# Patient Record
Sex: Female | Born: 1935 | Race: White | Hispanic: No | Smoking: Never smoker
Health system: Southern US, Community
[De-identification: ages and names within clinical notes are randomized; demographics above are authoritative.]

## PROBLEM LIST (undated history)

## (undated) DIAGNOSIS — I10 Essential (primary) hypertension: Secondary | ICD-10-CM

## (undated) DIAGNOSIS — N289 Disorder of kidney and ureter, unspecified: Secondary | ICD-10-CM

## (undated) DIAGNOSIS — E785 Hyperlipidemia, unspecified: Secondary | ICD-10-CM

## (undated) DIAGNOSIS — I4891 Unspecified atrial fibrillation: Secondary | ICD-10-CM

## (undated) DIAGNOSIS — R011 Cardiac murmur, unspecified: Secondary | ICD-10-CM

## (undated) DIAGNOSIS — H3530 Unspecified macular degeneration: Secondary | ICD-10-CM

## (undated) HISTORY — DX: Disorder of kidney and ureter, unspecified: N28.9

## (undated) HISTORY — DX: Essential (primary) hypertension: I10

## (undated) HISTORY — DX: Hyperlipidemia, unspecified: E78.5

## (undated) HISTORY — DX: Unspecified atrial fibrillation: I48.91

## (undated) HISTORY — DX: Unspecified macular degeneration: H35.30

## (undated) HISTORY — DX: Cardiac murmur, unspecified: R01.1

---
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
## 1997-10-09 HISTORY — PX: CATARACT EXTRACTION: SUR2

## 2005-02-28 ENCOUNTER — Ambulatory Visit: Payer: Self-pay

## 2005-04-05 ENCOUNTER — Ambulatory Visit: Payer: Self-pay

## 2006-06-21 ENCOUNTER — Ambulatory Visit: Payer: Self-pay

## 2006-06-26 ENCOUNTER — Ambulatory Visit: Payer: Self-pay

## 2008-05-29 ENCOUNTER — Ambulatory Visit: Payer: Self-pay | Admitting: Internal Medicine

## 2008-05-29 DIAGNOSIS — E785 Hyperlipidemia, unspecified: Secondary | ICD-10-CM

## 2008-05-29 DIAGNOSIS — M1990 Unspecified osteoarthritis, unspecified site: Secondary | ICD-10-CM | POA: Insufficient documentation

## 2008-05-29 DIAGNOSIS — R002 Palpitations: Secondary | ICD-10-CM

## 2008-05-29 DIAGNOSIS — R634 Abnormal weight loss: Secondary | ICD-10-CM

## 2008-05-29 DIAGNOSIS — H3530 Unspecified macular degeneration: Secondary | ICD-10-CM | POA: Insufficient documentation

## 2008-06-02 LAB — CONVERTED CEMR LAB
ALT: 13 units/L (ref 0–35)
AST: 20 units/L (ref 0–37)
Albumin: 4 g/dL (ref 3.5–5.2)
Alkaline Phosphatase: 73 units/L (ref 39–117)
BUN: 17 mg/dL (ref 6–23)
Basophils Absolute: 0 10*3/uL (ref 0.0–0.1)
Basophils Relative: 0.8 % (ref 0.0–3.0)
Bilirubin, Direct: 0.1 mg/dL (ref 0.0–0.3)
CO2: 31 meq/L (ref 19–32)
Calcium: 8.9 mg/dL (ref 8.4–10.5)
Chloride: 104 meq/L (ref 96–112)
Cholesterol: 270 mg/dL (ref 0–200)
Creatinine, Ser: 1 mg/dL (ref 0.4–1.2)
Direct LDL: 128.8 mg/dL
Eosinophils Absolute: 0.1 10*3/uL (ref 0.0–0.7)
Eosinophils Relative: 1.6 % (ref 0.0–5.0)
GFR calc Af Amer: 68 mL/min
GFR calc non Af Amer: 56 mL/min
Glucose, Bld: 95 mg/dL (ref 70–99)
HCT: 37.4 % (ref 36.0–46.0)
HDL: 97.6 mg/dL (ref >39.0)
Hemoglobin: 12.7 g/dL (ref 12.0–15.0)
Lymphocytes Relative: 26.1 % (ref 12.0–46.0)
MCHC: 33.9 g/dL (ref 30.0–36.0)
MCV: 101.4 fL — ABNORMAL HIGH (ref 78.0–100.0)
Monocytes Absolute: 0.5 10*3/uL (ref 0.1–1.0)
Monocytes Relative: 7.9 % (ref 3.0–12.0)
Neutro Abs: 3.7 10*3/uL (ref 1.4–7.7)
Neutrophils Relative %: 63.6 % (ref 43.0–77.0)
Phosphorus: 4.3 mg/dL (ref 2.3–4.6)
Platelets: 222 10*3/uL (ref 150–400)
Potassium: 3.9 meq/L (ref 3.5–5.1)
RBC: 3.69 M/uL — ABNORMAL LOW (ref 3.87–5.11)
RDW: 13.6 % (ref 11.5–14.6)
Sed Rate: 13 mm/hr (ref 0–22)
Sodium: 143 meq/L (ref 135–145)
TSH: 1.45 microintl units/mL (ref 0.35–5.50)
Total Bilirubin: 0.8 mg/dL (ref 0.3–1.2)
Total CHOL/HDL Ratio: 2.8
Total Protein: 6.8 g/dL (ref 6.0–8.3)
Triglycerides: 49 mg/dL (ref 0–149)
VLDL: 10 mg/dL (ref 0–40)
WBC: 5.8 10*3/uL (ref 4.5–10.5)

## 2010-08-30 ENCOUNTER — Inpatient Hospital Stay: Payer: Self-pay | Admitting: Internal Medicine

## 2010-08-30 ENCOUNTER — Encounter: Payer: Self-pay | Admitting: Family Medicine

## 2010-08-31 ENCOUNTER — Encounter: Payer: Self-pay | Admitting: Family Medicine

## 2010-08-31 ENCOUNTER — Encounter: Payer: Self-pay | Admitting: Internal Medicine

## 2010-09-01 ENCOUNTER — Encounter: Payer: Self-pay | Admitting: Internal Medicine

## 2010-09-02 ENCOUNTER — Encounter: Payer: Self-pay | Admitting: Family Medicine

## 2010-09-05 ENCOUNTER — Telehealth: Payer: Self-pay | Admitting: Internal Medicine

## 2010-09-06 ENCOUNTER — Encounter: Payer: Self-pay | Admitting: Internal Medicine

## 2010-09-08 ENCOUNTER — Encounter: Payer: Self-pay | Admitting: Family Medicine

## 2010-09-12 ENCOUNTER — Encounter: Payer: Self-pay | Admitting: Family Medicine

## 2010-09-14 ENCOUNTER — Encounter: Payer: Self-pay | Admitting: Family Medicine

## 2010-09-14 DIAGNOSIS — I10 Essential (primary) hypertension: Secondary | ICD-10-CM | POA: Insufficient documentation

## 2010-09-14 DIAGNOSIS — D62 Acute posthemorrhagic anemia: Secondary | ICD-10-CM | POA: Insufficient documentation

## 2010-09-14 DIAGNOSIS — K922 Gastrointestinal hemorrhage, unspecified: Secondary | ICD-10-CM | POA: Insufficient documentation

## 2010-09-15 ENCOUNTER — Telehealth: Payer: Self-pay | Admitting: Internal Medicine

## 2010-09-21 ENCOUNTER — Telehealth: Payer: Self-pay | Admitting: Family Medicine

## 2010-10-19 ENCOUNTER — Encounter: Payer: Self-pay | Admitting: Family Medicine

## 2010-10-24 ENCOUNTER — Telehealth: Payer: Self-pay | Admitting: Internal Medicine

## 2010-10-24 ENCOUNTER — Encounter: Payer: Self-pay | Admitting: Family Medicine

## 2010-10-27 ENCOUNTER — Other Ambulatory Visit: Payer: Self-pay | Admitting: Family Medicine

## 2010-10-27 ENCOUNTER — Ambulatory Visit
Admission: RE | Admit: 2010-10-27 | Discharge: 2010-10-27 | Payer: Self-pay | Source: Home / Self Care | Attending: Family Medicine | Admitting: Family Medicine

## 2010-10-27 LAB — BASIC METABOLIC PANEL
BUN: 25 mg/dL — ABNORMAL HIGH (ref 6–23)
CO2: 29 mEq/L (ref 19–32)
Calcium: 8.8 mg/dL (ref 8.4–10.5)
Chloride: 101 mEq/L (ref 96–112)
Creatinine, Ser: 1.1 mg/dL (ref 0.4–1.2)
GFR: 48.66 mL/min — ABNORMAL LOW (ref >60.00)
Glucose, Bld: 84 mg/dL (ref 70–99)
Potassium: 4.1 mEq/L (ref 3.5–5.1)
Sodium: 139 mEq/L (ref 135–145)

## 2010-11-08 NOTE — Progress Notes (Signed)
Summary: regarding follow up  ---- Converted from flag ---- ---- 09/05/2010 2:46 PM, Claris Gower MD wrote: thanks  ---- 09/05/2010 2:19 PM, Marty Heck CMA, AAMA wrote: Lindsey Cline pt, she is fine with waiting till next wednesday.  She will check with Lindsey Cline about getting on the schedule,  faxed request for records to [facility], sent this note to Dr. Deborra Medina.  ---- 09/05/2010 10:47 AM, Claris Gower MD wrote: We can set up appt with Dr Deborra Medina at the clinic there for next Wednesday. I don't see patients at the clinic there due to my schedule Otherwise needs appt here Please get hospital records No clinic this week, that is why it would have to be next week. Let Dr Lindsey Cline if that is what she wants  Rich  ---- 09/05/2010 8:28 AM, Marty Heck CMA, AAMA wrote: Pt says she was recently released from [facility] for rectal bleeding and she is asking that you see her for a follow up at [facility].  Please advise. ------------------------------  Phone Note Call from Patient

## 2010-11-08 NOTE — Miscellaneous (Signed)
Summary: [facility],Blood Pressure Readings  [facility],Blood Pressure Readings   Imported By: Virgia Land 09/15/2010 15:28:56  _____________________________________________________________________  External Attachment:    Type:   Image     Comment:   External Document

## 2010-11-08 NOTE — Consult Note (Signed)
Summary: Dr.Robert Elliott,[facility]  Dr.Robert [place]   Imported By: Virgia Land 09/07/2010 15:24:07  _____________________________________________________________________  External Attachment:    Type:   Image     Comment:   External Document

## 2010-11-08 NOTE — Assessment & Plan Note (Signed)
Summary: [facility] Office Visit- hospital follow up.   Vital Signs:  Patient profile:   77 year old female Weight:      113 pounds BP sitting:   156 / 72  History of Present Illness: 77 yo new to me here for hospital follow up.  Hospital records reviewed: Hospitalized at [facility] 11/22- 09/02/2010 s/p rectal bleed, posthemorrhagic anemia, HTN, and renal insufficiency.  anemia- Presented to ED with 3 episodes of BRBPR without pain and found to be hypertensive and anemic with Hgb 9.5.  GI (Dr. Tiffany Kocher) consulted, bleed believed to be due to diverticular self resolving bleeding.  Does not appear that she received blood transfusion.  Did receive fluids, no colonoscopy performed.  Hgb at discharge was 11.7.   Repeat labs drawn at [facility] on 12/5 showed hbg stable at 11.2   Has had no further episodes of rectal bleeding.  Taking iron sulfate 325 mg daily along with daily stool softener.  No issues with constipation.  Has advanced her diet to normal without difficulty.  renal insufficiency- most likely due to prerenal ARF.  Cr slightly elevated to 1.14 at admission, now 1.02.    htn- elevated throughtout her admission.  BPs since discharge randing from 158/68- 172/76.   Pt not on any antihypertensives.  She is asytmptomatic, denying any HA, blurred vision, SOB or LE edema.   BP at previous OV in 2009 was within this range as well although her husband had died at that time. BP today 156/62.  CXR and 2 d echo unremarkable, hyperdynamic, EF 75%.  Review of Systems      See HPI General:  Denies malaise. ENT:  Denies difficulty swallowing. CV:  Denies chest pain or discomfort. Resp:  Denies shortness of breath. GI:  Denies abdominal pain, bloody stools, change in bowel habits, constipation, and nausea. MS:  Denies joint pain, joint redness, and joint swelling. Derm:  Denies rash. Neuro:  Denies headaches. Psych:  Complains of anxiety; denies depression. Endo:  Denies cold intolerance and  heat intolerance. Heme:  Complains of abnormal bruising. Allergy:  Denies persistent infections.  Physical Exam  General:  alert and normal appearance.   Eyes:  vision grossly intact.   Mouth:  no erythema and no lesions.   Lungs:  normal respiratory effort and normal breath sounds.   Heart:  normal rate, regular rhythm, and no gallop.   III/VI SEM  Abdomen:  soft, non-tender, no hepatomegaly, and no splenomegaly.   Extremities:  no edema or calf tenderness Skin:  eecchymosis at IV sites bilaterally, no palp hematomas, no skin breakdown visible.   Psych:  normally interactive, good eye contact, not anxious appearing, and not depressed appearing.     Impression & Recommendations:  Problem # 1:  GASTROINTESTINAL HEMORRHAGE (ICD-578.9) Assessment New believed to be due to divertiular bleeding, spontaneously resolved  Problem # 2:  ACUTE POSTHEMORRHAGIC ANEMIA (ICD-285.1) Assessment: New stable hemoglobin.  MCV 102, so may be some other factors as well.  asymptomatic currently but if she develops symptoms, would check a B12/folate in future. Her updated medication list for this problem includes:    Ferrous Sulfate 325 (65 Fe) Mg Tabs (Ferrous sulfate) .Marland Kitchen... 1 tab by mouth daily.  Problem # 3:  ESSENTIAL HYPERTENSION, BENIGN (ICD-401.1) Assessment: New Advised to monitor BP as she is not symptomatic and her BP has ranged in 160s for years.  Pt in agreement with plan.  Complete Medication List: 1)  Ferrous Sulfate 325 (65 Fe) Mg Tabs (  Ferrous sulfate) .Marland Kitchen.. 1 tab by mouth daily.

## 2010-11-08 NOTE — Letter (Signed)
Summary: [facility]   Imported By: Virgia Land 09/07/2010 15:26:55  _____________________________________________________________________  External Attachment:    Type:   Image     Comment:   External Document

## 2010-11-08 NOTE — Progress Notes (Signed)
Summary: wants order for zostavax  Phone Note Call from Patient Call back at Home Phone [phone]   Caller: Patient Call For: Claris Gower MD Summary of Call: Pt is asking that order for zostavax be sent to [facility] [address] Initial call taken by: Marty Heck CMA, AAMA,  September 15, 2010 12:32 PM  Follow-up for Phone Call        order written Follow-up by: Claris Gower MD,  September 15, 2010 1:25 PM  Additional Follow-up for Phone Call Additional follow up Details #1::        order faxed and patient advised Additional Follow-up by: Edwin Dada CMA Deborra Medina),  September 15, 2010 2:44 PM    New/Updated Medications: ZOSTAVAX 38756 UNT/0.65ML SOLR (ZOSTER VACCINE LIVE) injection Prescriptions: ZOSTAVAX [id number] UNT/0.65ML SOLR (ZOSTER VACCINE LIVE) injection  #1 x 0   Entered by:   Edwin Dada CMA ([place])   Authorized by:   Claris Gower MD   Signed by:   Edwin Dada CMA ([place]) on 09/15/2010   Method used:   Faxed to ...       [facility] (retail)       [address]       [place], [place]    [place]       Ph: [phone]       Fax: [phone]   RxID:   [id number]

## 2010-11-08 NOTE — Miscellaneous (Signed)
Summary: FLU VACCINE  Clinical Lists Changes  Observations: Added new observation of FLU VAX: Historical (07/21/2010 15:37)      Influenza Immunization History:    Influenza # 1:  Historical (07/21/2010)

## 2010-11-08 NOTE — Letter (Signed)
Summary: [facility]   Imported By: Bubba Hales 09/10/2010 09:08:42  _____________________________________________________________________  External Attachment:    Type:   Image     Comment:   External Document

## 2010-11-10 NOTE — Assessment & Plan Note (Signed)
Summary: follow up on blood pressure/alc   Vital Signs:  Patient profile:   77 year old female Weight:      118.75 pounds Temp:     98.6 degrees F oral Pulse rate:   64 / minute Pulse rhythm:   regular BP sitting:   162 / 90  (left arm) Cuff size:   regular  Vitals Entered By: Sherrian Divers CMA Deborra Medina) (October 27, 2010 8:26 AM) CC: follow up on blood pressure   History of Present Illness: 77 yo here to follow up HTN.     HTN- started HCTZ 12.5 mg daily last month.  No noticible side effects.  Mandy, RN, has sent a log of her BPs.  BPs have improved since starting HCTZ but remain elevated.  BPs were ranging 160s-1802/70s-80s and now ranging 150s-160s/60s-70s.  Overall asymptomatic but at times feels like her heart is racing a little bit.  Denies any  HA, blurred vision, SOB or LE edema.   BP at previous OV in 2009 was within this range as well although her husband had died at that time.  BP in office today 162/90.  CXR and 2 d echo unremarkable in december, hyperdynamic, EF 75%.  Current Medications (verified): 1)  Hydrochlorothiazide 12.5 Mg  Tabs (Hydrochlorothiazide) .... 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 Son, Laverna Peace is her heatlh care power of attorney Requests DNR order--done  Risk Factors: Smoking Status: never (06-02-08)  Review of Systems  See HPI General:  Denies malaise. Eyes:  Denies blurring. CV:  Complains of palpitations; denies chest pain or discomfort. Resp:  Denies shortness of breath. Neuro:  Denies headaches, visual disturbances, and weakness.  Physical Exam  General:  alert and normal appearance.   hypertensive, otherwise VSS. Mouth:  no erythema and no lesions.   Lungs:  normal respiratory effort and normal breath sounds.   Heart:  normal rate, regular rhythm, and no gallop.   III/VI SEM  Extremities:  no edema or calf tenderness Neurologic:  alert & oriented X3 and gait normal.   Skin:     Psych:  normally interactive, good eye contact, not anxious appearing, and not depressed appearing.     Impression & Recommendations:  Problem # 1:  ESSENTIAL HYPERTENSION, BENIGN (ICD-401.1) Assessment Unchanged Still elevated.  Will increase HCTZ to 25 mg since at times she is a little symptomatic.  Also mentioned that we are getting close the anniversary of her husband's death and although she is coping she knows that her blood pressure increases around this time. Will recheck BMET today. Follow up with me in 1 month and with mandy for BP checks in the interim. Her updated medication list for this problem includes:    Hydrochlorothiazide 12.5 Mg Tabs (Hydrochlorothiazide) .Marland Kitchen... Take 1 tab  by mouth every morning  Orders: Venipuncture [id number]) TLB-BMP (Basic Metabolic Panel-BMET) ([id number])  Complete Medication List: 1)  Hydrochlorothiazide 12.5 Mg Tabs (Hydrochlorothiazide) .... Take 1 tab  by mouth every morning  Patient Instructions: 1)  Let's increase your hydrochlorothiazide to two tablets daily. 2)  Continue to have your blood pressure checked by [facility] every few days.     Orders Added: 1)  Venipuncture [id number] 2)  TLB-BMP (Basic Metabolic Panel-BMET) [id number] 3)  Est. Patient Level IV [id number]    Current Allergies (reviewed today): No known allergies

## 2010-11-10 NOTE — Progress Notes (Signed)
  Phone Note From Other Clinic   Summary of Call: Spoke with [facility].  BPs for Lindsey Cline remain elevated- 182/80, p 76 today, BP 168/72 yesterday.  Remains asymptomatic. Discussed with Lindsey Cline.  Will place on HCTZ 12.5 mg daily and she will follow up with me at [facility] clinic in 2 weeks.     New/Updated Medications: HYDROCHLOROTHIAZIDE 12.5 MG  TABS (HYDROCHLOROTHIAZIDE) Take 1 tab  by mouth every morning Prescriptions: HYDROCHLOROTHIAZIDE 12.5 MG  TABS (HYDROCHLOROTHIAZIDE) Take 1 tab  by mouth every morning  #90 x 3   Entered and Authorized by:   Arnette Norris MD   Signed by:   Arnette Norris MD on 09/21/2010   Method used:   Electronically to        [id number]* (retail)       [place], [place]  [address]       Ph: [phone]       Fax: [phone]   RxID:   [id number]

## 2010-11-10 NOTE — Progress Notes (Signed)
Summary: regarding BP  Phone Note From Other Clinic   Caller: Leafy Ro at [facility] Summary of Call: Leafy Ro called to report that pt has been having heart flutters, feels light headed with movement.  Sitting BP today was 158/66, pulse 64.  Standing BP was 172/88,   pulse 68.  Leafy Ro said that she faxed a list of BP readings, but it has not come over yet, no answer when I called Leafy Ro to ask her to fax it again.                   Marty Heck CMA, AAMA  October 24, 2010 4:21 PM   Follow-up for Phone Call        I got some in the past May be good idea to have Dr Deborra Medina check her in the clinic or set up appt here I am not really concerned about those BP readings I have reviewed the faxed BP reports and will leave for Dr Deborra Medina to review as well Claris Gower MD  October 24, 2010 5:36 PM   Additional Follow-up for Phone Call Additional follow up Details #1::        I would like for her to come here to set up appointment.  That way we can check our orthostatics here, blood work and or/ekg if necessary.  Please thank Leafy Ro for sending those readings. Arnette Norris MD  October 25, 2010 7:50 AM  left message for Leafy Ro to have pt set up readings here.   Additional Follow-up by: Edwin Dada CMA ([place]),  October 25, 2010 9:12 AM

## 2010-11-10 NOTE — Miscellaneous (Signed)
Summary: BP Log/[facility]  BP Log/[facility]   Imported By: Edmonia James 10/26/2010 11:25:24  _____________________________________________________________________  External Attachment:    Type:   Image     Comment:   External Document

## 2010-11-10 NOTE — Miscellaneous (Signed)
Summary: Physician Communication-BP's/[facility]  Physician Communication-BP's/[facility]   Imported By: Laural Benes 10/31/2010 10:38:21  _____________________________________________________________________  External Attachment:    Type:   Image     Comment:   External Document

## 2010-11-11 ENCOUNTER — Telehealth: Payer: Self-pay | Admitting: Family Medicine

## 2010-11-16 NOTE — Progress Notes (Signed)
Summary: regarding appt  Phone Note Call from Patient Call back at Home Phone [phone]   Caller: Patient Summary of Call: Pt was here on 1/19 and was told to follow up in one month.  She is asking if she can see you at [facility], rather then come here for appt.  Please advise. Initial call taken by: Marty Heck CMA, AAMA,  November 11, 2010 10:32 AM  Follow-up for Phone Call        yes that is fine. Arnette Norris MD  November 11, 2010 10:40 AM  Advised pt. Follow-up by: Marty Heck CMA, AAMA,  November 11, 2010 11:01 AM

## 2010-11-23 ENCOUNTER — Encounter: Payer: Self-pay | Admitting: Family Medicine

## 2010-11-23 DIAGNOSIS — I10 Essential (primary) hypertension: Secondary | ICD-10-CM

## 2010-11-30 NOTE — Assessment & Plan Note (Signed)
Summary: [facility] Visit   Vital Signs:  Patient profile:   77 year old female Weight:      118.75 pounds BP supine:   162 / 72  History of Present Illness: 77 yo here to follow up HTN.     HTN- increased HCTZ to 25 mgm daily last month.  No noticible side effects.  Mandy, RN, has sent a log of her BPs.  BPs have improved since starting HCTZ but remain elevated, ranging 1502-170s/60s-80s.   Overall asymptomatic but at times feels like her heart is racing a little bit.  Denies any  HA, blurred vision, SOB or LE edema.   BP at previous OV in 2009 was within this range as well although her husband had died at that time.   CXR and 2 d echo unremarkable in december, hyperdynamic, EF 75%.  Current Medications (verified): 1)  Lisinopril-Hydrochlorothiazide 20-12.5 Mg Tabs (Lisinopril-Hydrochlorothiazide) .Marland Kitchen.. 1 Tablet By Mouth Daily  Allergies (verified): No Known Drug Allergies  Review of Systems      See HPI Eyes:  Denies blurring. CV:  Denies chest pain or discomfort.  Physical Exam  General:  alert and normal appearance.   hypertensive, otherwise VSS. Mouth:  no erythema and no lesions.   Lungs:  normal respiratory effort and normal breath sounds.   Heart:  normal rate, regular rhythm, and no gallop.   III/VI SEM  Psych:  normally interactive, good eye contact, not anxious appearing, and not depressed appearing.     Impression & Recommendations:  Problem # 1:  ESSENTIAL HYPERTENSION, BENIGN (ICD-401.1) Assessment Unchanged  remains elevated. will decrease HCTZ back down to 12.5 mg and add lisinopril 20 mg daily (combo pill). Pt to follow up with mandy, rn next  week and with me in one month. Her updated medication list for this problem includes:    Lisinopril-hydrochlorothiazide 20-12.5 Mg Tabs (Lisinopril-hydrochlorothiazide) .Marland Kitchen... 1 tablet by mouth daily  Orders: Prescription Created Electronically [id number])  Complete Medication List: 1)   Lisinopril-hydrochlorothiazide 20-12.5 Mg Tabs (Lisinopril-hydrochlorothiazide) .Marland Kitchen.. 1 tablet by mouth daily Prescriptions: LISINOPRIL-HYDROCHLOROTHIAZIDE 20-12.5 MG TABS (LISINOPRIL-HYDROCHLOROTHIAZIDE) 1 tablet by mouth daily  #90 x 3   Entered and Authorized by:   Arnette Norris MD   Signed by:   Arnette Norris MD on 11/23/2010   Method used:   Electronically to        [id number]* (retail)       [place], [place]  [address]       Ph: [phone]       Fax: [phone]   RxID:   [id number]    Orders Added: 1)  Prescription Created Electronically [id number]

## 2010-12-21 ENCOUNTER — Encounter: Payer: Self-pay | Admitting: Family Medicine

## 2010-12-21 ENCOUNTER — Non-Acute Institutional Stay: Payer: Self-pay | Admitting: Family Medicine

## 2010-12-21 DIAGNOSIS — I10 Essential (primary) hypertension: Secondary | ICD-10-CM

## 2010-12-21 NOTE — Progress Notes (Unsigned)
  Subjective:    Patient ID: Lindsey Cline, female    DOB: Feb 11, 1919, 77 y.o.   MRN: [id number]  HPI    Review of Systems     Objective:   Physical Exam        Assessment & Plan:

## 2010-12-27 NOTE — Assessment & Plan Note (Signed)
Summary: [facility] Visit   Vital Signs:  Patient profile:   77 year old female Weight:      118.75 pounds Pulse rate:   72 / minute BP supine:   132 / 56  History of Present Illness: 77 yo here to follow up HTN.     HTN- decreased  HCTZ back down to 12.5  mg daily and added Lisinopril 20 mg daily last month as she was still having some elevated blood pressures.   She has had a little cough this week but has allergies, does not think it is due to the medication.  No other noticible side effects.  Mandy, RN, has sent a log of her BPs.   Overall asymptomatic but at times feels like her heart is racing a little bit.  Denies any  HA, blurred vision, SOB or LE edema.    BP this week when [facility] checked it was 136/52.   CXR and 2 d echo unremarkable in december, hyperdynamic, EF 75%.  Allergies: No Known Drug Allergies  Review of Systems      See HPI General:  Denies malaise. Eyes:  Denies blurring. CV:  Denies chest pain or discomfort. Resp:  Denies shortness of breath.  Physical Exam  General:  alert and normal appearance.   normotensive Mouth:  no erythema and no lesions.   Lungs:  normal respiratory effort and normal breath sounds.   Heart:  normal rate, regular rhythm, and no gallop.   III/VI SEM  Psych:  normally interactive, good eye contact, not anxious appearing, and not depressed appearing.     Impression & Recommendations:  Problem # 1:  ESSENTIAL HYPERTENSION, BENIGN (ICD-401.1) Assessment Improved Doing great.  Continue current dose. We need to monitor cough to make sure not caused my ACEI.  She will call me in a couple of weeks with an update. Bi monthly BP checks with Mandy. Her updated medication list for this problem includes:    Lisinopril-hydrochlorothiazide 20-12.5 Mg Tabs (Lisinopril-hydrochlorothiazide) .Marland Kitchen... 1 tablet by mouth daily  Complete Medication List: 1)  Lisinopril-hydrochlorothiazide 20-12.5 Mg Tabs (Lisinopril-hydrochlorothiazide)  .Marland Kitchen.. 1 tablet by mouth daily

## 2011-04-18 ENCOUNTER — Encounter: Payer: Self-pay | Admitting: Internal Medicine

## 2011-04-19 ENCOUNTER — Non-Acute Institutional Stay: Payer: Medicare Other | Admitting: Family Medicine

## 2011-04-19 DIAGNOSIS — I10 Essential (primary) hypertension: Secondary | ICD-10-CM

## 2011-04-19 NOTE — Progress Notes (Signed)
77 yo here for follow HTN HTN- added Lisinopril 20 mg daily to  HCTZ 12.5 mg daily in March.  No noticible side effects. Denies any HA, blurred vision, SOB or LE edema.  Had one episode last week while she was sitting up at her beauty shop that she felt dizzy.  Has not occurred since.    Review of Systems  See HPI  [facility]S:  No CP, No Le edema  Physical exam: BP 122/74  Pulse 62 General: alert and normal appearance.  hypertensive, otherwise VSS.  Mouth: no erythema and no lesions.  Lungs: normal respiratory effort and normal breath sounds.  Heart: normal rate, regular rhythm, and no gallop.  III/VI SEM  Psych: normally interactive, good eye contact, not anxious appearing, and not depressed appearing.   Assessment and Plan:  1. Essential hypertension, benign    Stable on current meds. I advised her to have Mandy check her BP a few times over the next several weeks to make sure that it's not dropping too low. The patient indicates understanding of these issues and agrees with the plan.

## 2011-10-16 DIAGNOSIS — H353290 Exudative age-related macular degeneration, unspecified eye, stage unspecified: Secondary | ICD-10-CM | POA: Diagnosis not present

## 2011-11-06 DIAGNOSIS — B351 Tinea unguium: Secondary | ICD-10-CM | POA: Diagnosis not present

## 2011-11-06 DIAGNOSIS — M79609 Pain in unspecified limb: Secondary | ICD-10-CM | POA: Diagnosis not present

## 2011-11-13 ENCOUNTER — Other Ambulatory Visit: Payer: Self-pay | Admitting: *Deleted

## 2011-11-13 DIAGNOSIS — H353290 Exudative age-related macular degeneration, unspecified eye, stage unspecified: Secondary | ICD-10-CM | POA: Diagnosis not present

## 2011-11-13 MED ORDER — LISINOPRIL-HYDROCHLOROTHIAZIDE 20-12.5 MG PO TABS: 1.0000 | ORAL_TABLET | Freq: Every day | ORAL | Status: DC

## 2011-11-13 NOTE — Telephone Encounter (Signed)
[facility]. 

## 2011-11-14 ENCOUNTER — Telehealth: Payer: Self-pay | Admitting: Family Medicine

## 2011-11-14 MED ORDER — LISINOPRIL-HYDROCHLOROTHIAZIDE 20-12.5 MG PO TABS: 1.0000 | ORAL_TABLET | Freq: Every day | ORAL | Status: DC

## 2011-11-14 NOTE — Telephone Encounter (Signed)
[facility] to [facility]

## 2011-11-14 NOTE — Telephone Encounter (Signed)
Mandi advised

## 2011-11-14 NOTE — Telephone Encounter (Signed)
Mandi from [facility] is calling about Mrs. Lindsey Cline. She is completely out and Donah Driver was wondering if the pt needs to come in for a visit before it can be refilled or if it can be sent to pt's pharmacy... [facility] [address]. Mandi's direct cell is [phone].

## 2011-12-04 DIAGNOSIS — H3530 Unspecified macular degeneration: Secondary | ICD-10-CM | POA: Diagnosis not present

## 2011-12-18 DIAGNOSIS — H353290 Exudative age-related macular degeneration, unspecified eye, stage unspecified: Secondary | ICD-10-CM | POA: Diagnosis not present

## 2012-02-05 DIAGNOSIS — M79609 Pain in unspecified limb: Secondary | ICD-10-CM | POA: Diagnosis not present

## 2012-02-05 DIAGNOSIS — B351 Tinea unguium: Secondary | ICD-10-CM | POA: Diagnosis not present

## 2012-03-22 DIAGNOSIS — H353290 Exudative age-related macular degeneration, unspecified eye, stage unspecified: Secondary | ICD-10-CM | POA: Diagnosis not present

## 2012-04-05 DIAGNOSIS — H353290 Exudative age-related macular degeneration, unspecified eye, stage unspecified: Secondary | ICD-10-CM | POA: Diagnosis not present

## 2012-05-07 DIAGNOSIS — M79609 Pain in unspecified limb: Secondary | ICD-10-CM | POA: Diagnosis not present

## 2012-05-07 DIAGNOSIS — B351 Tinea unguium: Secondary | ICD-10-CM | POA: Diagnosis not present

## 2012-05-10 DIAGNOSIS — H353290 Exudative age-related macular degeneration, unspecified eye, stage unspecified: Secondary | ICD-10-CM | POA: Diagnosis not present

## 2012-05-13 ENCOUNTER — Emergency Department: Payer: Self-pay | Admitting: Emergency Medicine

## 2012-05-13 ENCOUNTER — Inpatient Hospital Stay: Payer: Self-pay | Admitting: Specialist

## 2012-05-13 DIAGNOSIS — K5732 Diverticulitis of large intestine without perforation or abscess without bleeding: Secondary | ICD-10-CM | POA: Diagnosis not present

## 2012-05-13 DIAGNOSIS — M6281 Muscle weakness (generalized): Secondary | ICD-10-CM | POA: Diagnosis not present

## 2012-05-13 DIAGNOSIS — K5730 Diverticulosis of large intestine without perforation or abscess without bleeding: Secondary | ICD-10-CM | POA: Diagnosis not present

## 2012-05-13 DIAGNOSIS — K5731 Diverticulosis of large intestine without perforation or abscess with bleeding: Secondary | ICD-10-CM | POA: Diagnosis present

## 2012-05-13 DIAGNOSIS — K922 Gastrointestinal hemorrhage, unspecified: Secondary | ICD-10-CM | POA: Diagnosis not present

## 2012-05-13 DIAGNOSIS — E785 Hyperlipidemia, unspecified: Secondary | ICD-10-CM | POA: Diagnosis not present

## 2012-05-13 DIAGNOSIS — Z90710 Acquired absence of both cervix and uterus: Secondary | ICD-10-CM | POA: Diagnosis not present

## 2012-05-13 DIAGNOSIS — K921 Melena: Secondary | ICD-10-CM | POA: Diagnosis not present

## 2012-05-13 DIAGNOSIS — Z8249 Family history of ischemic heart disease and other diseases of the circulatory system: Secondary | ICD-10-CM | POA: Diagnosis not present

## 2012-05-13 DIAGNOSIS — Z66 Do not resuscitate: Secondary | ICD-10-CM | POA: Diagnosis not present

## 2012-05-13 DIAGNOSIS — I129 Hypertensive chronic kidney disease with stage 1 through stage 4 chronic kidney disease, or unspecified chronic kidney disease: Secondary | ICD-10-CM | POA: Diagnosis present

## 2012-05-13 DIAGNOSIS — N179 Acute kidney failure, unspecified: Secondary | ICD-10-CM | POA: Diagnosis not present

## 2012-05-13 DIAGNOSIS — I10 Essential (primary) hypertension: Secondary | ICD-10-CM | POA: Diagnosis not present

## 2012-05-13 DIAGNOSIS — R262 Difficulty in walking, not elsewhere classified: Secondary | ICD-10-CM | POA: Diagnosis not present

## 2012-05-13 DIAGNOSIS — Z9089 Acquired absence of other organs: Secondary | ICD-10-CM | POA: Diagnosis not present

## 2012-05-13 DIAGNOSIS — K920 Hematemesis: Secondary | ICD-10-CM | POA: Diagnosis not present

## 2012-05-13 DIAGNOSIS — D62 Acute posthemorrhagic anemia: Secondary | ICD-10-CM | POA: Diagnosis present

## 2012-05-13 LAB — COMPREHENSIVE METABOLIC PANEL
Albumin: 3.7 g/dL (ref 3.4–5.0)
Alkaline Phosphatase: 71 U/L (ref 50–136)
Anion Gap: 10 (ref 7–16)
BUN: 37 mg/dL — ABNORMAL HIGH (ref 7–18)
Bilirubin,Total: 0.4 mg/dL (ref 0.2–1.0)
Calcium, Total: 8.7 mg/dL (ref 8.5–10.1)
Chloride: 108 mmol/L — ABNORMAL HIGH (ref 98–107)
Co2: 26 mmol/L (ref 21–32)
Creatinine: 1.41 mg/dL — ABNORMAL HIGH (ref 0.60–1.30)
EGFR (African American): 38 — ABNORMAL LOW
EGFR (Non-African Amer.): 33 — ABNORMAL LOW
Glucose: 117 mg/dL — ABNORMAL HIGH (ref 65–99)
Osmolality: 297 (ref 275–301)
Potassium: 4.6 mmol/L (ref 3.5–5.1)
SGOT(AST): 28 U/L (ref 15–37)
SGPT (ALT): 14 U/L (ref 12–78)
Sodium: 144 mmol/L (ref 136–145)
Total Protein: 7.1 g/dL (ref 6.4–8.2)

## 2012-05-13 LAB — CBC
HCT: 33.2 % — ABNORMAL LOW (ref 35.0–47.0)
HGB: 11.4 g/dL — ABNORMAL LOW (ref 12.0–16.0)
MCHC: 34.3 g/dL (ref 32.0–36.0)
RDW: 14.6 % — ABNORMAL HIGH (ref 11.5–14.5)
WBC: 7.3 10*3/uL (ref 3.6–11.0)

## 2012-05-13 LAB — URINALYSIS, COMPLETE
Bilirubin,UR: NEGATIVE
Glucose,UR: NEGATIVE mg/dL (ref 0–75)
Granular Cast: 6
Ketone: NEGATIVE
Nitrite: NEGATIVE
Ph: 5 (ref 4.5–8.0)
Protein: NEGATIVE
RBC,UR: 8 /HPF (ref 0–5)
Specific Gravity: 1.012 (ref 1.003–1.030)
Squamous Epithelial: 2
WBC UR: 5 /HPF (ref 0–5)

## 2012-05-13 LAB — PROTIME-INR
INR: 0.9
Prothrombin Time: 12.2 secs (ref 11.5–14.7)

## 2012-05-13 LAB — HEMOGLOBIN: HGB: 10.2 g/dL — ABNORMAL LOW (ref 12.0–16.0)

## 2012-05-14 LAB — CBC WITH DIFFERENTIAL/PLATELET
Basophil #: 0 10*3/uL (ref 0.0–0.1)
Eosinophil #: 0.1 10*3/uL (ref 0.0–0.7)
HCT: 26.1 % — ABNORMAL LOW (ref 35.0–47.0)
HGB: 9 g/dL — ABNORMAL LOW (ref 12.0–16.0)
Lymphocyte #: 1.5 10*3/uL (ref 1.0–3.6)
MCHC: 34.5 g/dL (ref 32.0–36.0)
MCV: 100 fL (ref 80–100)
Monocyte #: 0.4 x10 3/mm (ref 0.2–0.9)
Monocyte %: 7.6 %
Neutrophil #: 3.5 10*3/uL (ref 1.4–6.5)
Neutrophil %: 63.2 %
Platelet: 170 10*3/uL (ref 150–440)
RDW: 14.6 % — ABNORMAL HIGH (ref 11.5–14.5)
WBC: 5.6 10*3/uL (ref 3.6–11.0)

## 2012-05-14 LAB — BASIC METABOLIC PANEL
Anion Gap: 7 (ref 7–16)
BUN: 39 mg/dL — ABNORMAL HIGH (ref 7–18)
Chloride: 111 mmol/L — ABNORMAL HIGH (ref 98–107)
Glucose: 92 mg/dL (ref 65–99)
Osmolality: 294 (ref 275–301)
Sodium: 143 mmol/L (ref 136–145)

## 2012-05-16 DIAGNOSIS — M6281 Muscle weakness (generalized): Secondary | ICD-10-CM | POA: Diagnosis not present

## 2012-05-16 DIAGNOSIS — I10 Essential (primary) hypertension: Secondary | ICD-10-CM | POA: Diagnosis not present

## 2012-05-16 DIAGNOSIS — Z90710 Acquired absence of both cervix and uterus: Secondary | ICD-10-CM | POA: Diagnosis not present

## 2012-05-16 DIAGNOSIS — K922 Gastrointestinal hemorrhage, unspecified: Secondary | ICD-10-CM | POA: Diagnosis not present

## 2012-05-16 DIAGNOSIS — K5730 Diverticulosis of large intestine without perforation or abscess without bleeding: Secondary | ICD-10-CM | POA: Diagnosis not present

## 2012-05-16 DIAGNOSIS — N179 Acute kidney failure, unspecified: Secondary | ICD-10-CM | POA: Diagnosis not present

## 2012-05-16 DIAGNOSIS — R262 Difficulty in walking, not elsewhere classified: Secondary | ICD-10-CM | POA: Diagnosis not present

## 2012-05-16 LAB — CBC WITH DIFFERENTIAL/PLATELET
Basophil #: 0.1 10*3/uL (ref 0.0–0.1)
Basophil %: 1 %
Eosinophil #: 0.2 10*3/uL (ref 0.0–0.7)
HCT: 24.2 % — ABNORMAL LOW (ref 35.0–47.0)
HGB: 8.4 g/dL — ABNORMAL LOW (ref 12.0–16.0)
Lymphocyte #: 1.7 10*3/uL (ref 1.0–3.6)
Lymphocyte %: 26.1 %
MCH: 34.4 pg — ABNORMAL HIGH (ref 26.0–34.0)
MCHC: 34.9 g/dL (ref 32.0–36.0)
MCV: 99 fL (ref 80–100)
Monocyte #: 0.5 x10 3/mm (ref 0.2–0.9)
Neutrophil #: 4 10*3/uL (ref 1.4–6.5)
RDW: 15 % — ABNORMAL HIGH (ref 11.5–14.5)
WBC: 6.4 10*3/uL (ref 3.6–11.0)

## 2012-05-21 DIAGNOSIS — I10 Essential (primary) hypertension: Secondary | ICD-10-CM

## 2012-05-21 DIAGNOSIS — K922 Gastrointestinal hemorrhage, unspecified: Secondary | ICD-10-CM | POA: Diagnosis not present

## 2012-06-03 DIAGNOSIS — R262 Difficulty in walking, not elsewhere classified: Secondary | ICD-10-CM | POA: Diagnosis not present

## 2012-06-03 DIAGNOSIS — M6281 Muscle weakness (generalized): Secondary | ICD-10-CM | POA: Diagnosis not present

## 2012-06-04 DIAGNOSIS — R262 Difficulty in walking, not elsewhere classified: Secondary | ICD-10-CM | POA: Diagnosis not present

## 2012-06-04 DIAGNOSIS — M6281 Muscle weakness (generalized): Secondary | ICD-10-CM | POA: Diagnosis not present

## 2012-06-07 DIAGNOSIS — R262 Difficulty in walking, not elsewhere classified: Secondary | ICD-10-CM | POA: Diagnosis not present

## 2012-06-07 DIAGNOSIS — M6281 Muscle weakness (generalized): Secondary | ICD-10-CM | POA: Diagnosis not present

## 2012-06-17 DIAGNOSIS — H353290 Exudative age-related macular degeneration, unspecified eye, stage unspecified: Secondary | ICD-10-CM | POA: Diagnosis not present

## 2012-07-11 DIAGNOSIS — K922 Gastrointestinal hemorrhage, unspecified: Secondary | ICD-10-CM | POA: Diagnosis not present

## 2012-07-22 DIAGNOSIS — H353290 Exudative age-related macular degeneration, unspecified eye, stage unspecified: Secondary | ICD-10-CM | POA: Diagnosis not present

## 2012-08-12 DIAGNOSIS — M79609 Pain in unspecified limb: Secondary | ICD-10-CM | POA: Diagnosis not present

## 2012-08-12 DIAGNOSIS — B351 Tinea unguium: Secondary | ICD-10-CM | POA: Diagnosis not present

## 2012-08-30 DIAGNOSIS — H353290 Exudative age-related macular degeneration, unspecified eye, stage unspecified: Secondary | ICD-10-CM | POA: Diagnosis not present

## 2012-09-30 DIAGNOSIS — H353290 Exudative age-related macular degeneration, unspecified eye, stage unspecified: Secondary | ICD-10-CM | POA: Diagnosis not present

## 2012-11-04 ENCOUNTER — Other Ambulatory Visit: Payer: Self-pay | Admitting: Internal Medicine

## 2012-11-04 ENCOUNTER — Other Ambulatory Visit: Payer: Self-pay | Admitting: Family Medicine

## 2012-11-04 NOTE — Telephone Encounter (Signed)
Is this your patient?

## 2012-11-04 NOTE — Telephone Encounter (Signed)
Denied medication sent back to pharmacy

## 2012-11-04 NOTE — Telephone Encounter (Signed)
Ok to refill but please have her come as as I have not seen her since 04/2011.

## 2012-11-04 NOTE — Telephone Encounter (Signed)
Tell the pharmacy she must be seeing another doctor

## 2012-11-04 NOTE — Telephone Encounter (Signed)
Patient states she will make appt at [facility].

## 2012-11-04 NOTE — Telephone Encounter (Signed)
Per Dr.Letvak, he is no longer seeing this patient.  Are you?

## 2012-11-04 NOTE — Telephone Encounter (Signed)
No  Not sure Dr Deborra Medina still seeing either Will send for her review

## 2012-11-11 DIAGNOSIS — M79609 Pain in unspecified limb: Secondary | ICD-10-CM | POA: Diagnosis not present

## 2012-11-11 DIAGNOSIS — B351 Tinea unguium: Secondary | ICD-10-CM | POA: Diagnosis not present

## 2012-11-14 DIAGNOSIS — L578 Other skin changes due to chronic exposure to nonionizing radiation: Secondary | ICD-10-CM | POA: Diagnosis not present

## 2012-11-14 DIAGNOSIS — I8310 Varicose veins of unspecified lower extremity with inflammation: Secondary | ICD-10-CM | POA: Diagnosis not present

## 2012-11-14 DIAGNOSIS — L821 Other seborrheic keratosis: Secondary | ICD-10-CM | POA: Diagnosis not present

## 2012-11-14 DIAGNOSIS — L570 Actinic keratosis: Secondary | ICD-10-CM | POA: Diagnosis not present

## 2012-12-25 DIAGNOSIS — K625 Hemorrhage of anus and rectum: Secondary | ICD-10-CM | POA: Diagnosis not present

## 2013-01-02 DIAGNOSIS — L821 Other seborrheic keratosis: Secondary | ICD-10-CM | POA: Diagnosis not present

## 2013-01-02 DIAGNOSIS — L820 Inflamed seborrheic keratosis: Secondary | ICD-10-CM | POA: Diagnosis not present

## 2013-01-02 DIAGNOSIS — L570 Actinic keratosis: Secondary | ICD-10-CM | POA: Diagnosis not present

## 2013-01-02 DIAGNOSIS — L578 Other skin changes due to chronic exposure to nonionizing radiation: Secondary | ICD-10-CM | POA: Diagnosis not present

## 2013-01-27 DIAGNOSIS — H353290 Exudative age-related macular degeneration, unspecified eye, stage unspecified: Secondary | ICD-10-CM | POA: Diagnosis not present

## 2013-02-10 DIAGNOSIS — M79609 Pain in unspecified limb: Secondary | ICD-10-CM | POA: Diagnosis not present

## 2013-02-10 DIAGNOSIS — B351 Tinea unguium: Secondary | ICD-10-CM | POA: Diagnosis not present

## 2013-03-01 ENCOUNTER — Other Ambulatory Visit: Payer: Self-pay | Admitting: Family Medicine

## 2013-03-11 ENCOUNTER — Other Ambulatory Visit: Payer: Self-pay | Admitting: Family Medicine

## 2013-03-12 ENCOUNTER — Telehealth: Payer: Self-pay | Admitting: *Deleted

## 2013-03-12 MED ORDER — LISINOPRIL-HYDROCHLOROTHIAZIDE 20-12.5 MG PO TABS: ORAL_TABLET | ORAL | Status: DC

## 2013-03-12 NOTE — Telephone Encounter (Signed)
Pt called requesting refill on lisinopril/hctz. I advised her she would need to make an appointment since she has not been seen since 2012. She lives at [facility] and doesn't drive. She wants to know if you could possibly see her there? She request a call back from our office on whether or not you could see her at [facility]. I refilled rx.

## 2013-03-12 NOTE — Telephone Encounter (Signed)
Patient advised and says she would like for the 20th if possible. She said she would like to have mandy call and let her know exactly when you are going to see her

## 2013-03-12 NOTE — Telephone Encounter (Signed)
I could see her but it would not be until the week after next since I am off next weds and Friday.

## 2013-03-26 ENCOUNTER — Encounter: Payer: Medicare Other | Admitting: Family Medicine

## 2013-04-02 ENCOUNTER — Encounter: Payer: Medicare Other | Admitting: Family Medicine

## 2013-04-07 DIAGNOSIS — H353290 Exudative age-related macular degeneration, unspecified eye, stage unspecified: Secondary | ICD-10-CM | POA: Diagnosis not present

## 2013-05-12 DIAGNOSIS — B351 Tinea unguium: Secondary | ICD-10-CM | POA: Diagnosis not present

## 2013-05-12 DIAGNOSIS — M79609 Pain in unspecified limb: Secondary | ICD-10-CM | POA: Diagnosis not present

## 2013-06-11 ENCOUNTER — Other Ambulatory Visit: Payer: Self-pay | Admitting: Family Medicine

## 2013-06-11 NOTE — Telephone Encounter (Signed)
[facility] patient, ok to refill?

## 2013-06-16 DIAGNOSIS — H353190 Nonexudative age-related macular degeneration, unspecified eye, stage unspecified: Secondary | ICD-10-CM | POA: Diagnosis not present

## 2013-06-16 DIAGNOSIS — H353290 Exudative age-related macular degeneration, unspecified eye, stage unspecified: Secondary | ICD-10-CM | POA: Diagnosis not present

## 2013-06-18 ENCOUNTER — Telehealth: Payer: Self-pay | Admitting: *Deleted

## 2013-06-18 NOTE — Telephone Encounter (Signed)
I called and left a voice mail message for Leafy Ro asking her to schedule a time for you to see patient at [facility].  Asked her to call me back if any questions.

## 2013-06-18 NOTE — Telephone Encounter (Signed)
Message copied by Ricki Miller on Wed Jun 18, 2013 10:36 AM ------      Message from: Lucille Passy      Created: Wed Jun 11, 2013  5:03 PM       Ok to refill rx but please make sure she has appt to see me. ------

## 2013-06-18 NOTE — Telephone Encounter (Signed)
Sharee Pimple at [facility] returned my call.  She is asking that you give her a date and time that you can see the patient at [facility].  Please advise.

## 2013-07-25 DIAGNOSIS — Z23 Encounter for immunization: Secondary | ICD-10-CM | POA: Diagnosis not present

## 2013-08-11 ENCOUNTER — Ambulatory Visit: Payer: Self-pay | Admitting: Podiatry

## 2013-08-11 ENCOUNTER — Encounter: Payer: Self-pay | Admitting: Podiatry

## 2013-08-11 ENCOUNTER — Ambulatory Visit (INDEPENDENT_AMBULATORY_CARE_PROVIDER_SITE_OTHER): Payer: Medicare Other | Admitting: Podiatry

## 2013-08-11 VITALS — BP 132/65 | HR 82 | Resp 16 | Ht 65.0 in | Wt 112.0 lb

## 2013-08-11 DIAGNOSIS — M79609 Pain in unspecified limb: Secondary | ICD-10-CM | POA: Diagnosis not present

## 2013-08-11 DIAGNOSIS — B351 Tinea unguium: Secondary | ICD-10-CM | POA: Diagnosis not present

## 2013-08-11 NOTE — Progress Notes (Signed)
This patient presents today with a chief complaint of painful elongated toenails.  Objective evaluation: Demonstrates strong palpable pulses bilateral. Nails are thick yellow dystrophic clinically mycotic.  Assessment: Pain in limb secondary to onychomycosis bilateral.  Plan: Debridement of nails 1 through 5 bilateral covered service secondary to pain.

## 2013-09-10 ENCOUNTER — Other Ambulatory Visit: Payer: Self-pay | Admitting: Family Medicine

## 2013-09-15 DIAGNOSIS — H43819 Vitreous degeneration, unspecified eye: Secondary | ICD-10-CM | POA: Diagnosis not present

## 2013-09-15 DIAGNOSIS — H353290 Exudative age-related macular degeneration, unspecified eye, stage unspecified: Secondary | ICD-10-CM | POA: Diagnosis not present

## 2013-09-16 NOTE — Telephone Encounter (Signed)
She is not in health care at [facility] now She must have a different primary care doctor---they will need to request from them

## 2013-09-16 NOTE — Telephone Encounter (Signed)
Received refill request electronically. This appears to be a nursing home patient. Is it okay to refill?

## 2013-09-16 NOTE — Telephone Encounter (Signed)
rx denied and sent back to pharmacy

## 2013-09-22 DIAGNOSIS — H353290 Exudative age-related macular degeneration, unspecified eye, stage unspecified: Secondary | ICD-10-CM | POA: Diagnosis not present

## 2013-10-07 ENCOUNTER — Other Ambulatory Visit: Payer: Self-pay | Admitting: Family Medicine

## 2013-10-24 DIAGNOSIS — H353290 Exudative age-related macular degeneration, unspecified eye, stage unspecified: Secondary | ICD-10-CM | POA: Diagnosis not present

## 2013-10-27 DIAGNOSIS — H353290 Exudative age-related macular degeneration, unspecified eye, stage unspecified: Secondary | ICD-10-CM | POA: Diagnosis not present

## 2013-11-10 ENCOUNTER — Encounter: Payer: Self-pay | Admitting: Podiatry

## 2013-11-10 ENCOUNTER — Ambulatory Visit (INDEPENDENT_AMBULATORY_CARE_PROVIDER_SITE_OTHER): Payer: Medicare Other | Admitting: Podiatry

## 2013-11-10 VITALS — BP 104/49 | HR 87 | Resp 16 | Ht 65.0 in | Wt 110.0 lb

## 2013-11-10 DIAGNOSIS — B351 Tinea unguium: Secondary | ICD-10-CM | POA: Diagnosis not present

## 2013-11-10 DIAGNOSIS — M79609 Pain in unspecified limb: Secondary | ICD-10-CM

## 2013-11-10 NOTE — Progress Notes (Signed)
She presents today with a chief complaint of painful toenails one through 5 bilateral.  Objective: Pulses are palpable bilateral. Nails are thick yellow dystrophic onychomycotic and painful palpation.   Assessment: Pain in limb secondary to onychomycosis 1 through 5 bilateral.  Plan: Debridement of nails 1 through 5 bilateral covered service secondary to pain.

## 2013-11-21 DIAGNOSIS — H353290 Exudative age-related macular degeneration, unspecified eye, stage unspecified: Secondary | ICD-10-CM | POA: Diagnosis not present

## 2013-12-25 ENCOUNTER — Ambulatory Visit (INDEPENDENT_AMBULATORY_CARE_PROVIDER_SITE_OTHER): Payer: Medicare Other | Admitting: Family Medicine

## 2013-12-25 ENCOUNTER — Encounter: Payer: Self-pay | Admitting: Family Medicine

## 2013-12-25 VITALS — BP 152/70 | HR 77 | Temp 97.3°F | Ht 61.0 in | Wt 113.0 lb

## 2013-12-25 DIAGNOSIS — H9190 Unspecified hearing loss, unspecified ear: Secondary | ICD-10-CM

## 2013-12-25 DIAGNOSIS — R269 Unspecified abnormalities of gait and mobility: Secondary | ICD-10-CM

## 2013-12-25 DIAGNOSIS — I10 Essential (primary) hypertension: Secondary | ICD-10-CM | POA: Diagnosis not present

## 2013-12-25 DIAGNOSIS — Z136 Encounter for screening for cardiovascular disorders: Secondary | ICD-10-CM | POA: Diagnosis not present

## 2013-12-25 DIAGNOSIS — R2681 Unsteadiness on feet: Secondary | ICD-10-CM | POA: Insufficient documentation

## 2013-12-25 LAB — COMPREHENSIVE METABOLIC PANEL
ALK PHOS: 64 U/L (ref 39–117)
ALT: 11 U/L (ref 0–35)
AST: 23 U/L (ref 0–37)
Albumin: 4.2 g/dL (ref 3.5–5.2)
BILIRUBIN TOTAL: 0.6 mg/dL (ref 0.3–1.2)
BUN: 34 mg/dL — ABNORMAL HIGH (ref 6–23)
CO2: 22 mEq/L (ref 19–32)
CREATININE: 1.6 mg/dL — AB (ref 0.4–1.2)
Calcium: 8.7 mg/dL (ref 8.4–10.5)
Chloride: 108 mEq/L (ref 96–112)
GFR: 33.21 mL/min — ABNORMAL LOW (ref >60.00)
GLUCOSE: 102 mg/dL — AB (ref 70–99)
Potassium: 4.5 mEq/L (ref 3.5–5.1)
SODIUM: 143 meq/L (ref 135–145)
TOTAL PROTEIN: 7 g/dL (ref 6.0–8.3)

## 2013-12-25 LAB — LIPID PANEL
Cholesterol: 291 mg/dL — ABNORMAL HIGH (ref 0–200)
HDL: 88.8 mg/dL (ref >39.00)
LDL CALC: 182 mg/dL — AB (ref 0–99)
Total CHOL/HDL Ratio: 3
Triglycerides: 101 mg/dL (ref 0.0–149.0)
VLDL: 20.2 mg/dL (ref 0.0–40.0)

## 2013-12-25 MED ORDER — LISINOPRIL-HYDROCHLOROTHIAZIDE 20-12.5 MG PO TABS: ORAL_TABLET | ORAL | Status: DC

## 2013-12-25 NOTE — Progress Notes (Signed)
Pre visit review using our clinic review tool, if applicable. No additional management support is needed unless otherwise documented below in the visit note. 

## 2013-12-25 NOTE — Progress Notes (Signed)
Subjective:   Patient ID: Lindsey Cline, female    DOB: 03-Mar-1919, 80 y.o.   MRN: [id number]  Lindsey Cline is a pleasant 80 y.o. year old female who presents to clinic today with [place]  on 12/25/2013  HPI: She used to see Dr. Silvio Pate and I have seen her at [facility].  She is here today with her son and daughter in law.  HTN- has not taken her medication today. Has been on Lisinopril-HCTZ 20-12.5 mg daily for years.  Denies any HA, blurred vision, CP or SOB. Remains very active- takes the stairs everyday and walks down the hallway.  She is concerned that her gait is a little unsteady.  No fall but does not feel as confident when she is walking.  H/o GIB in 2011.  No recurrent issues.  Has noticed decreased hearing.  Wears hearing aids and has not had them changed or adjusted in several years.  Appetite good- weight stable. Wt Readings from Last 3 Encounters:  12/25/13 113 lb (51.256 kg)  11/10/13 110 lb (49.896 kg)  08/11/13 112 lb (50.803 kg)    Patient Active Problem List   Diagnosis Date Noted  . Unsteady gait 12/25/2013  . Hearing loss 12/25/2013  . ACUTE POSTHEMORRHAGIC ANEMIA 09/14/2010  . ESSENTIAL HYPERTENSION, BENIGN 09/14/2010  . GASTROINTESTINAL HEMORRHAGE 09/14/2010  . HYPERLIPIDEMIA 05/29/2008  . MACULAR DEGENERATION 05/29/2008  . OSTEOARTHRITIS 05/29/2008  . WEIGHT LOSS, ABNORMAL 05/29/2008  . PALPITATIONS 05/29/2008   Past Medical History  Diagnosis Date  . Macular degeneration   . Hyperlipidemia   . Osteoporosis     osteoarthritis   Past Surgical History  Procedure Laterality Date  . Appendectomy  1942  . Vaginal hysterectomy  1952  . Tonsillectomy  1928    ? Adenoids  . Inguinal hernia repair  1946    Left  . Cataract extraction  1999   History  Substance Use Topics  . Smoking status: Never Smoker   . Smokeless tobacco: Never Used  . Alcohol Use: 1.2 oz/week    2 Glasses of wine per week   Comment: WINE 5 DAYS PER WEEK   Family History  Problem Relation Age of Onset  . Cancer Mother     colon  . Heart disease Father    No Known Allergies Current Outpatient Prescriptions on File Prior to Visit  Medication Sig Dispense Refill  . Probiotic Product (PROBIOTIC DAILY PO) Take by mouth daily.       No current facility-administered medications on file prior to visit.   The PMH, PSH, Social History, Family History, Medications, and allergies have been reviewed in [facility], and have been updated if relevant.  Review of Systems  Constitutional: Negative for appetite change.  HENT: Positive for postnasal drip.   Respiratory: Negative.   Cardiovascular: Negative.   Gastrointestinal: Negative.   Genitourinary: Negative.   Psychiatric/Behavioral: Negative.   All other systems reviewed and are negative.       Objective:    BP 152/70  Pulse 77  Temp(Src) 97.3 F (36.3 C) (Oral)  Ht 5\' 1"  (1.549 m)  Wt 113 lb (51.256 kg)  BMI 21.36 kg/m2  SpO2 97%   Physical Exam   General: alert and normal appearance.  HEENT:  TMs clear bilaterally, no cerumen Mouth: no erythema and no lesions.  Lungs: normal respiratory effort and normal breath sounds.  Heart: normal rate, regular rhythm, and no gallop.  III/VI SEM  Psych: normally interactive, good  eye contact, not anxious appearing, and not depressed appearing.  Neuro- normal gait      Assessment & Plan:   Unsteady gait  Essential hypertension, benign - Plan: Comprehensive metabolic panel  Screening for ischemic heart disease - Plan: Lipid panel No Follow-up on file.

## 2013-12-25 NOTE — Patient Instructions (Addendum)
Great to see you and it was nice to meet your family. You do NOT have any wax build up in your ears.  Please take the order for physical therapy/occupational therapy evaluation to [facility].  We will call you with your lab results.

## 2013-12-25 NOTE — Assessment & Plan Note (Signed)
Reasonable control. No changes. Due for labs today. Orders Placed This Encounter  Procedures  . Comprehensive metabolic panel  . Lipid panel

## 2013-12-25 NOTE — Assessment & Plan Note (Signed)
No cerumen- advised following up with audiologist. The patient indicates understanding of these issues and agrees with the plan.

## 2013-12-25 NOTE — Assessment & Plan Note (Signed)
Refer to PT/OT at [facility] for assessment.  Would likely benefit from a walker.

## 2013-12-26 ENCOUNTER — Telehealth: Payer: Self-pay | Admitting: Family Medicine

## 2013-12-26 NOTE — Telephone Encounter (Signed)
Relevant patient education mailed to patient.  

## 2014-01-02 DIAGNOSIS — H353290 Exudative age-related macular degeneration, unspecified eye, stage unspecified: Secondary | ICD-10-CM | POA: Diagnosis not present

## 2014-01-08 ENCOUNTER — Ambulatory Visit (INDEPENDENT_AMBULATORY_CARE_PROVIDER_SITE_OTHER): Payer: Medicare Other | Admitting: Family Medicine

## 2014-01-08 ENCOUNTER — Encounter: Payer: Self-pay | Admitting: Family Medicine

## 2014-01-08 VITALS — BP 152/70 | HR 81 | Temp 97.4°F | Wt 114.5 lb

## 2014-01-08 DIAGNOSIS — N289 Disorder of kidney and ureter, unspecified: Secondary | ICD-10-CM | POA: Insufficient documentation

## 2014-01-08 DIAGNOSIS — E785 Hyperlipidemia, unspecified: Secondary | ICD-10-CM | POA: Diagnosis not present

## 2014-01-08 DIAGNOSIS — I10 Essential (primary) hypertension: Secondary | ICD-10-CM

## 2014-01-08 NOTE — Progress Notes (Signed)
Pre visit review using our clinic review tool, if applicable. No additional management support is needed unless otherwise documented below in the visit note. 

## 2014-01-08 NOTE — Assessment & Plan Note (Signed)
Worsened by her prepared dinners which are high in saturated fats. Advised buying lean cuisine or other low fat dinners.  Hand out given about good food choices to lower cholesterol.  Recheck cholesterol in 3 months. The patient indicates understanding of these issues and agrees with the plan.

## 2014-01-08 NOTE — Progress Notes (Signed)
Subjective:   Patient ID: Lindsey Cline, female    DOB: 02/03/1919, 80 y.o.   MRN: [id number]  Lindsey Cline is a pleasant 80 y.o. year old female who presents with her son to clinic today with Follow-up  on 01/08/2014  HPI:   Established care with me on 3/19.    HTN-  Has been on Lisinopril-HCTZ 20-12.5 mg daily for years.  Denies any HA, blurred vision, CP or SOB. Remains very active- takes the stairs everyday and walks down the hallway.  BP Readings from Last 3 Encounters:  01/08/14 152/70  12/25/13 152/70  11/10/13 104/49    She is concerned that her gait is a little unsteady.  No fall but does not feel as confident when she is walking so I referred her for PT at last office visit.  She has not yet made the appointment.  Renal insufficiency- Cr elevated to 1.6 at last office visit.  She denies taking any NSAIDs.  Admits to not drinking any water.  Drinks coffee in am, juice in afternoon.  HLD- Lab Results  Component Value Date   CHOL 291* 12/25/2013   HDL 88.80 12/25/2013   LDLCALC 182* 12/25/2013   LDLDIRECT 128.8 05/29/2008   TRIG 101.0 12/25/2013   CHOLHDL 3 12/25/2013   Does not cook for herself.  Eats mainly full fat frozen dinners for every meal.     Appetite good- weight stable. Wt Readings from Last 3 Encounters:  01/08/14 114 lb 8 oz (51.937 kg)  12/25/13 113 lb (51.256 kg)  11/10/13 110 lb (49.896 kg)    Patient Active Problem List   Diagnosis Date Noted  . Renal insufficiency 01/08/2014  . Unsteady gait 12/25/2013  . Hearing loss 12/25/2013  . ACUTE POSTHEMORRHAGIC ANEMIA 09/14/2010  . ESSENTIAL HYPERTENSION, BENIGN 09/14/2010  . GASTROINTESTINAL HEMORRHAGE 09/14/2010  . HYPERLIPIDEMIA 05/29/2008  . MACULAR DEGENERATION 05/29/2008  . OSTEOARTHRITIS 05/29/2008  . PALPITATIONS 05/29/2008   Past Medical History  Diagnosis Date  . Macular degeneration   . Hyperlipidemia   . Osteoporosis     osteoarthritis   Past  Surgical History  Procedure Laterality Date  . Appendectomy  1942  . Vaginal hysterectomy  1952  . Tonsillectomy  1928    ? Adenoids  . Inguinal hernia repair  1946    Left  . Cataract extraction  1999   History  Substance Use Topics  . Smoking status: Never Smoker   . Smokeless tobacco: Never Used  . Alcohol Use: 1.2 oz/week    2 Glasses of wine per week     Comment: WINE 5 DAYS PER WEEK   Family History  Problem Relation Age of Onset  . Cancer Mother     colon  . Heart disease Father    No Known Allergies Current Outpatient Prescriptions on File Prior to Visit  Medication Sig Dispense Refill  . lisinopril-hydrochlorothiazide (PRINZIDE,ZESTORETIC) 20-12.5 MG per tablet TAKE 1 TABLET EVERY DAY  90 tablet  3  . Probiotic Product (PROBIOTIC DAILY PO) Take by mouth daily.       No current facility-administered medications on file prior to visit.   The PMH, PSH, Social History, Family History, Medications, and allergies have been reviewed in [facility], and have been updated if relevant.  Review of Systems  Constitutional: Negative for appetite change.  HENT: Positive for postnasal drip.   Respiratory: Negative.   Cardiovascular: Negative.   Gastrointestinal: Negative.   Genitourinary: Negative.   Psychiatric/Behavioral: Negative.  All other systems reviewed and are negative.       Objective:    BP 152/70  Pulse 81  Temp(Src) 97.4 F (36.3 C) (Oral)  Wt 114 lb 8 oz (51.937 kg)  SpO2 97%   Physical Exam   General: alert and normal appearance.  HEENT:  TMs clear bilaterally, no cerumen Mouth: no erythema and no lesions.  Lungs: normal respiratory effort and normal breath sounds.  Heart: normal rate, regular rhythm, and no gallop.  III/VI SEM  Psych: normally interactive, good eye contact, not anxious appearing, and not depressed appearing.  Neuro- normal gait      Assessment & Plan:   Renal insufficiency - Plan: Comprehensive metabolic  panel  HYPERLIPIDEMIA  Essential hypertension, benign No Follow-up on file.

## 2014-01-08 NOTE — Assessment & Plan Note (Signed)
Appears pre renal. I had a long talk with Lindsey Cline and her son about the importance of drinking water while taking BP medication --not TOO much, we talked about hyponatremia from drinking too much water as well.  Advised 3-4 eight ounces glasses per day.  Recheck renal function in 2 weeks.

## 2014-01-08 NOTE — Patient Instructions (Signed)
Good to see you. Please drink more fluids like you're doing.  Come back for labs before your trip.

## 2014-01-19 ENCOUNTER — Other Ambulatory Visit (INDEPENDENT_AMBULATORY_CARE_PROVIDER_SITE_OTHER): Payer: Medicare Other

## 2014-01-19 DIAGNOSIS — N289 Disorder of kidney and ureter, unspecified: Secondary | ICD-10-CM

## 2014-01-19 LAB — COMPREHENSIVE METABOLIC PANEL
ALT: 12 U/L (ref 0–35)
AST: 23 U/L (ref 0–37)
Albumin: 3.8 g/dL (ref 3.5–5.2)
Alkaline Phosphatase: 57 U/L (ref 39–117)
BUN: 25 mg/dL — ABNORMAL HIGH (ref 6–23)
CO2: 25 mEq/L (ref 19–32)
CREATININE: 1.3 mg/dL — AB (ref 0.4–1.2)
Calcium: 8.9 mg/dL (ref 8.4–10.5)
Chloride: 104 mEq/L (ref 96–112)
GFR: 39.28 mL/min — ABNORMAL LOW (ref >60.00)
Glucose, Bld: 90 mg/dL (ref 70–99)
Potassium: 4.4 mEq/L (ref 3.5–5.1)
Sodium: 140 mEq/L (ref 135–145)
Total Bilirubin: 0.7 mg/dL (ref 0.3–1.2)
Total Protein: 6.7 g/dL (ref 6.0–8.3)

## 2014-01-20 ENCOUNTER — Other Ambulatory Visit: Payer: Self-pay | Admitting: Family Medicine

## 2014-01-20 DIAGNOSIS — R7989 Other specified abnormal findings of blood chemistry: Secondary | ICD-10-CM

## 2014-02-03 ENCOUNTER — Telehealth: Payer: Self-pay

## 2014-02-03 NOTE — Telephone Encounter (Signed)
pts son, Jeneen Rinks said he received a call from [facility] about scheduling appt for his mother; Jeneen Rinks request clarification about appt; Jeneen Rinks said pt had repeat labs on 01/19/14 which were improved and does not understand why pt needs to go and why waited until now to do referral instead of doing referral when pt had last labs. I gave Jeneen Rinks the information from 01/19/14 result note that even thou renal function had improved was still abnormal and Dr Deborra Medina wanted to refer to kidney specialist and pt was notified and agreed to referral. I also apologized for referral time; that our pt care coordinator is getting in referrals as fast as possible. Jeneen Rinks said that was fine now that he understands reason for referral and Jeneen Rinks will call [place] back and schedule appt. Jeneen Rinks said from previous conversation with [facility] it make take another 2-3 weeks to get pt seen. Jeneen Rinks does not require a cb.

## 2014-02-09 ENCOUNTER — Ambulatory Visit: Payer: Medicare Other | Admitting: Podiatry

## 2014-02-09 DIAGNOSIS — I10 Essential (primary) hypertension: Secondary | ICD-10-CM | POA: Diagnosis not present

## 2014-02-09 DIAGNOSIS — R809 Proteinuria, unspecified: Secondary | ICD-10-CM | POA: Diagnosis not present

## 2014-02-09 DIAGNOSIS — N183 Chronic kidney disease, stage 3 unspecified: Secondary | ICD-10-CM | POA: Diagnosis not present

## 2014-02-10 DIAGNOSIS — H353290 Exudative age-related macular degeneration, unspecified eye, stage unspecified: Secondary | ICD-10-CM | POA: Diagnosis not present

## 2014-03-05 DIAGNOSIS — N183 Chronic kidney disease, stage 3 unspecified: Secondary | ICD-10-CM | POA: Diagnosis not present

## 2014-03-13 DIAGNOSIS — N2581 Secondary hyperparathyroidism of renal origin: Secondary | ICD-10-CM | POA: Diagnosis not present

## 2014-03-13 DIAGNOSIS — N183 Chronic kidney disease, stage 3 unspecified: Secondary | ICD-10-CM | POA: Diagnosis not present

## 2014-03-13 DIAGNOSIS — I10 Essential (primary) hypertension: Secondary | ICD-10-CM | POA: Diagnosis not present

## 2014-03-31 DIAGNOSIS — H353290 Exudative age-related macular degeneration, unspecified eye, stage unspecified: Secondary | ICD-10-CM | POA: Diagnosis not present

## 2014-05-18 DIAGNOSIS — H353290 Exudative age-related macular degeneration, unspecified eye, stage unspecified: Secondary | ICD-10-CM | POA: Diagnosis not present

## 2014-05-27 ENCOUNTER — Ambulatory Visit: Payer: Medicare Other | Admitting: Podiatry

## 2014-06-03 ENCOUNTER — Ambulatory Visit (INDEPENDENT_AMBULATORY_CARE_PROVIDER_SITE_OTHER): Payer: Medicare Other | Admitting: Podiatry

## 2014-06-03 ENCOUNTER — Encounter: Payer: Self-pay | Admitting: Podiatry

## 2014-06-03 DIAGNOSIS — M79676 Pain in unspecified toe(s): Secondary | ICD-10-CM

## 2014-06-03 DIAGNOSIS — M79609 Pain in unspecified limb: Secondary | ICD-10-CM

## 2014-06-03 DIAGNOSIS — B351 Tinea unguium: Secondary | ICD-10-CM

## 2014-06-03 NOTE — Progress Notes (Signed)
She presents today chief complaint of painful elongated toenails.  Objective: Pulses are palpable bilateral. Nails are thick yellow dystrophic onychomycotic and painful palpation.  Assessment: Pain in limb secondary to onychomycosis 1 through 5 bilateral.  Plan: Debridement of nails 1 through 5 bilateral covered service secondary to pain.

## 2014-07-03 DIAGNOSIS — H353290 Exudative age-related macular degeneration, unspecified eye, stage unspecified: Secondary | ICD-10-CM | POA: Diagnosis not present

## 2014-07-06 DIAGNOSIS — N183 Chronic kidney disease, stage 3 unspecified: Secondary | ICD-10-CM | POA: Diagnosis not present

## 2014-07-06 DIAGNOSIS — N2581 Secondary hyperparathyroidism of renal origin: Secondary | ICD-10-CM | POA: Diagnosis not present

## 2014-07-06 DIAGNOSIS — R809 Proteinuria, unspecified: Secondary | ICD-10-CM | POA: Diagnosis not present

## 2014-07-06 DIAGNOSIS — I10 Essential (primary) hypertension: Secondary | ICD-10-CM | POA: Diagnosis not present

## 2014-08-10 ENCOUNTER — Ambulatory Visit (INDEPENDENT_AMBULATORY_CARE_PROVIDER_SITE_OTHER): Payer: Medicare Other | Admitting: Podiatry

## 2014-08-10 DIAGNOSIS — B351 Tinea unguium: Secondary | ICD-10-CM

## 2014-08-10 DIAGNOSIS — M79676 Pain in unspecified toe(s): Secondary | ICD-10-CM

## 2014-08-10 NOTE — Progress Notes (Signed)
Presents today chief complaint of painful elongated toenails.  Objective: Pulses are palpable bilateral nails are thick, yellow dystrophic onychomycosis and painful palpation.   Assessment: Onychomycosis with pain in limb.  Plan: Treatment of nails in thickness and length as covered service secondary to pain.  

## 2014-08-28 DIAGNOSIS — H3532 Exudative age-related macular degeneration: Secondary | ICD-10-CM | POA: Diagnosis not present

## 2014-09-02 ENCOUNTER — Ambulatory Visit: Payer: Medicare Other | Admitting: Podiatry

## 2014-10-21 DIAGNOSIS — H3532 Exudative age-related macular degeneration: Secondary | ICD-10-CM | POA: Diagnosis not present

## 2014-10-26 DIAGNOSIS — R809 Proteinuria, unspecified: Secondary | ICD-10-CM | POA: Diagnosis not present

## 2014-10-26 DIAGNOSIS — N183 Chronic kidney disease, stage 3 (moderate): Secondary | ICD-10-CM | POA: Diagnosis not present

## 2014-10-26 DIAGNOSIS — N2581 Secondary hyperparathyroidism of renal origin: Secondary | ICD-10-CM | POA: Diagnosis not present

## 2014-10-26 DIAGNOSIS — I10 Essential (primary) hypertension: Secondary | ICD-10-CM | POA: Diagnosis not present

## 2014-11-11 ENCOUNTER — Ambulatory Visit (INDEPENDENT_AMBULATORY_CARE_PROVIDER_SITE_OTHER): Payer: Medicare Other | Admitting: Podiatry

## 2014-11-11 DIAGNOSIS — B351 Tinea unguium: Secondary | ICD-10-CM | POA: Diagnosis not present

## 2014-11-11 DIAGNOSIS — M79676 Pain in unspecified toe(s): Secondary | ICD-10-CM | POA: Diagnosis not present

## 2014-11-11 NOTE — Progress Notes (Signed)
She presents today with chief complaint of painful toenails 1 through 5 bilateral.  Objective: Nails are thick yellow dystrophic with mycotic and painful palpation 1 through 5 bilateral.  Assessment: Pain in limb standard onychomycosis 1 through 5 bilateral.  Plan: Debridement of nails 1 through 5 bilateral covered service pain.

## 2014-11-20 ENCOUNTER — Other Ambulatory Visit: Payer: Self-pay | Admitting: Family Medicine

## 2014-12-03 ENCOUNTER — Telehealth: Payer: Self-pay | Admitting: Family Medicine

## 2014-12-03 NOTE — Telephone Encounter (Signed)
Ok to call and give verbal order for PT to evaluate and treat for shoulder pain.

## 2014-12-03 NOTE — Telephone Encounter (Signed)
Pt called in complaining of shoulder pain.  She stated that she wants Dr Deborra Medina to call the PT department at [facility] so that she can begin receiving PT.  The number there is [phone].  She states that as long as a call is made for a referral or order then she can receive services.. If you have any additional questions, the pt's number is [phone]. Thanks

## 2014-12-03 NOTE — Telephone Encounter (Signed)
Tried to call PT department at [facility] but the phone just kept ringing.

## 2014-12-03 NOTE — Telephone Encounter (Signed)
Please advise 

## 2014-12-04 ENCOUNTER — Telehealth: Payer: Self-pay | Admitting: Family Medicine

## 2014-12-04 NOTE — Telephone Encounter (Signed)
Lm on [phone], which is the number to independent living where pt resides, per [facility]. Left verbal order for PT; advised to contact office back if needed.

## 2014-12-04 NOTE — Telephone Encounter (Signed)
Dr Deborra Medina is out of the office on today. Order to be faxed upon her return

## 2014-12-04 NOTE — Telephone Encounter (Signed)
Wilford Sports from [place] returned your call. She is requesting that you to fax over a written order for PT.  [facility] fax to [phone].  Attention Wells Guiles or Shawn.  Thanks.

## 2014-12-07 NOTE — Telephone Encounter (Signed)
Faxed to [facility] as requested

## 2014-12-07 NOTE — Telephone Encounter (Signed)
Shoulder pain. The is something that the pt requested, not [facility], per [employer]

## 2014-12-07 NOTE — Telephone Encounter (Signed)
Attempted to contact Lindsey Cline; she was away from desk. Requested a call back with Dx for PT

## 2014-12-07 NOTE — Telephone Encounter (Signed)
Order written but for what diagnosis?  It is in my box.

## 2014-12-07 NOTE — Telephone Encounter (Signed)
Noted.  Diagnosis added to order.

## 2014-12-08 DIAGNOSIS — M25511 Pain in right shoulder: Secondary | ICD-10-CM | POA: Diagnosis not present

## 2014-12-10 DIAGNOSIS — M25511 Pain in right shoulder: Secondary | ICD-10-CM | POA: Diagnosis not present

## 2014-12-11 DIAGNOSIS — M25511 Pain in right shoulder: Secondary | ICD-10-CM | POA: Diagnosis not present

## 2014-12-14 DIAGNOSIS — M25511 Pain in right shoulder: Secondary | ICD-10-CM | POA: Diagnosis not present

## 2014-12-16 DIAGNOSIS — M25511 Pain in right shoulder: Secondary | ICD-10-CM | POA: Diagnosis not present

## 2014-12-17 ENCOUNTER — Other Ambulatory Visit: Payer: Self-pay | Admitting: Family Medicine

## 2014-12-17 DIAGNOSIS — M25511 Pain in right shoulder: Secondary | ICD-10-CM | POA: Diagnosis not present

## 2014-12-17 DIAGNOSIS — I10 Essential (primary) hypertension: Secondary | ICD-10-CM

## 2014-12-21 ENCOUNTER — Other Ambulatory Visit (INDEPENDENT_AMBULATORY_CARE_PROVIDER_SITE_OTHER): Payer: Medicare Other

## 2014-12-21 DIAGNOSIS — I10 Essential (primary) hypertension: Secondary | ICD-10-CM

## 2014-12-21 LAB — LIPID PANEL
Cholesterol: 227 mg/dL — ABNORMAL HIGH (ref 0–200)
HDL: 73.8 mg/dL (ref >39.00)
LDL CALC: 136 mg/dL — AB (ref 0–99)
NONHDL: 153.2
Total CHOL/HDL Ratio: 3
Triglycerides: 84 mg/dL (ref 0.0–149.0)
VLDL: 16.8 mg/dL (ref 0.0–40.0)

## 2014-12-21 LAB — CBC WITH DIFFERENTIAL/PLATELET
Basophils Absolute: 0 10*3/uL (ref 0.0–0.1)
Basophils Relative: 0.4 % (ref 0.0–3.0)
EOS ABS: 0.1 10*3/uL (ref 0.0–0.7)
Eosinophils Relative: 0.8 % (ref 0.0–5.0)
HCT: 34.6 % — ABNORMAL LOW (ref 36.0–46.0)
Hemoglobin: 11.7 g/dL — ABNORMAL LOW (ref 12.0–15.0)
Lymphocytes Relative: 19.6 % (ref 12.0–46.0)
Lymphs Abs: 1.2 10*3/uL (ref 0.7–4.0)
MCHC: 33.7 g/dL (ref 30.0–36.0)
MCV: 98.8 fl (ref 78.0–100.0)
MONO ABS: 0.3 10*3/uL (ref 0.1–1.0)
MONOS PCT: 5.6 % (ref 3.0–12.0)
NEUTROS ABS: 4.6 10*3/uL (ref 1.4–7.7)
NEUTROS PCT: 73.6 % (ref 43.0–77.0)
Platelets: 212 10*3/uL (ref 150.0–400.0)
RBC: 3.5 Mil/uL — AB (ref 3.87–5.11)
RDW: 15 % (ref 11.5–15.5)
WBC: 6.2 10*3/uL (ref 4.0–10.5)

## 2014-12-21 LAB — COMPREHENSIVE METABOLIC PANEL
ALBUMIN: 4 g/dL (ref 3.5–5.2)
ALT: 22 U/L (ref 0–35)
AST: 24 U/L (ref 0–37)
Alkaline Phosphatase: 64 U/L (ref 39–117)
BUN: 30 mg/dL — AB (ref 6–23)
CALCIUM: 9 mg/dL (ref 8.4–10.5)
CO2: 27 mEq/L (ref 19–32)
Chloride: 107 mEq/L (ref 96–112)
Creatinine, Ser: 1.64 mg/dL — ABNORMAL HIGH (ref 0.40–1.20)
GFR: 31.05 mL/min — ABNORMAL LOW (ref >60.00)
Glucose, Bld: 104 mg/dL — ABNORMAL HIGH (ref 70–99)
Potassium: 4.4 mEq/L (ref 3.5–5.1)
SODIUM: 141 meq/L (ref 135–145)
TOTAL PROTEIN: 6.6 g/dL (ref 6.0–8.3)
Total Bilirubin: 0.4 mg/dL (ref 0.2–1.2)

## 2014-12-21 LAB — TSH: TSH: 1.54 u[IU]/mL (ref 0.35–4.50)

## 2014-12-22 DIAGNOSIS — M25511 Pain in right shoulder: Secondary | ICD-10-CM | POA: Diagnosis not present

## 2014-12-28 ENCOUNTER — Encounter: Payer: Self-pay | Admitting: Family Medicine

## 2014-12-28 ENCOUNTER — Ambulatory Visit (INDEPENDENT_AMBULATORY_CARE_PROVIDER_SITE_OTHER): Payer: Medicare Other | Admitting: Family Medicine

## 2014-12-28 ENCOUNTER — Emergency Department: Payer: Self-pay | Admitting: Emergency Medicine

## 2014-12-28 VITALS — BP 158/86 | HR 108 | Temp 97.5°F | Ht 61.0 in | Wt 113.8 lb

## 2014-12-28 DIAGNOSIS — M159 Polyosteoarthritis, unspecified: Secondary | ICD-10-CM | POA: Diagnosis not present

## 2014-12-28 DIAGNOSIS — E785 Hyperlipidemia, unspecified: Secondary | ICD-10-CM

## 2014-12-28 DIAGNOSIS — Z23 Encounter for immunization: Secondary | ICD-10-CM

## 2014-12-28 DIAGNOSIS — I499 Cardiac arrhythmia, unspecified: Secondary | ICD-10-CM | POA: Diagnosis not present

## 2014-12-28 DIAGNOSIS — Z0000 Encounter for general adult medical examination without abnormal findings: Secondary | ICD-10-CM | POA: Diagnosis not present

## 2014-12-28 DIAGNOSIS — I4891 Unspecified atrial fibrillation: Secondary | ICD-10-CM | POA: Diagnosis not present

## 2014-12-28 DIAGNOSIS — Z66 Do not resuscitate: Secondary | ICD-10-CM | POA: Diagnosis not present

## 2014-12-28 DIAGNOSIS — N183 Chronic kidney disease, stage 3 unspecified: Secondary | ICD-10-CM

## 2014-12-28 DIAGNOSIS — I10 Essential (primary) hypertension: Secondary | ICD-10-CM

## 2014-12-28 DIAGNOSIS — Z79899 Other long term (current) drug therapy: Secondary | ICD-10-CM | POA: Diagnosis not present

## 2014-12-28 DIAGNOSIS — R0602 Shortness of breath: Secondary | ICD-10-CM | POA: Diagnosis not present

## 2014-12-28 MED ORDER — AMLODIPINE BESYLATE 5 MG PO TABS: 2.5000 mg | ORAL_TABLET | Freq: Every day | ORAL | Status: DC

## 2014-12-28 MED ORDER — ZOSTER VACCINE LIVE 19400 UNT/0.65ML ~~LOC~~ SOLR: 0.6500 mL | Freq: Once | SUBCUTANEOUS | Status: DC

## 2014-12-28 NOTE — Progress Notes (Signed)
Subjective:   Patient ID: Lindsey Cline, female    DOB: 11/20/1918, 81 y.o.   MRN: [id number]  Luanda Kirschenman Treonna Rasnake is a pleasant 81 y.o. year old female who presents with her son to clinic today with her son for Annual Exam and Shoulder Pain  on 12/28/2014  HPI:   I have personally reviewed the Medicare Annual Wellness questionnaire and have noted 1. The patient's medical and social history 2. Their use of alcohol, tobacco or illicit drugs 3. Their current medications and supplements 4. The patient's functional ability including ADL's, fall risks, home safety risks and hearing or visual             impairment. 5. Diet and physical activities 6. Evidence for depression or mood disorders  End of life wishes discussed and updated in Social History.  The roster of all physicians providing medical care to patient - is listed in the Snapshot section of the chart. She is thinking about moving to ALF as she is feeling isolated in independent living and more unsteady gait.  No recent falls.  Eye exam 11/20/13 Tdap 05/10/07 Flu vaccine 07/15/14 Pneumovax 05/20/07 Remote h/o hysterectomy  MD- going for injection in left eye on Wednesday.  She has been more SOB past few days, especially with exertion.  Some dizziness.  No CP.  No syncope.  HTN-  Has been on Lisinopril-HCTZ 20-12.5 mg daily for years.  Also taking amlodipine 2.5 mg. Denies any HA, blurred vision, CP or SOB. Remains very active- takes the stairs everyday and walks down the hallway.  Lab Results  Component Value Date   CREATININE 1.64* 12/21/2014   Lab Results  Component Value Date   CHOL 227* 12/21/2014   HDL 73.80 12/21/2014   LDLCALC 136* 12/21/2014   LDLDIRECT 128.8 05/29/2008   TRIG 84.0 12/21/2014   CHOLHDL 3 12/21/2014   Lab Results  Component Value Date   WBC 6.2 12/21/2014   HGB 11.7* 12/21/2014   HCT 34.6* 12/21/2014   MCV 98.8 12/21/2014   PLT 212.0 12/21/2014   Lab Results    Component Value Date   ALT 22 12/21/2014   AST 24 12/21/2014   ALKPHOS 64 12/21/2014   BILITOT 0.4 12/21/2014     BP Readings from Last 3 Encounters:  12/28/14 158/86  01/08/14 152/70  12/25/13 152/70    HLD- Lab Results  Component Value Date   CHOL 227* 12/21/2014   HDL 73.80 12/21/2014   LDLCALC 136* 12/21/2014   LDLDIRECT 128.8 05/29/2008   TRIG 84.0 12/21/2014   CHOLHDL 3 12/21/2014    CKD III- followed by [facility], Dr. Holley Raring.  Last saw him on 10/26/14- note reviewed. Stable.  No changes made.     Appetite good- weight stable. Wt Readings from Last 3 Encounters:  12/28/14 113 lb 12 oz (51.597 kg)  01/08/14 114 lb 8 oz (51.937 kg)  12/25/13 113 lb (51.256 kg)    Patient Active Problem List   Diagnosis Date Noted  . Medicare annual wellness visit, subsequent 12/28/2014  . Renal insufficiency 01/08/2014  . Unsteady gait 12/25/2013  . Hearing loss 12/25/2013  . ESSENTIAL HYPERTENSION, BENIGN 09/14/2010  . GASTROINTESTINAL HEMORRHAGE 09/14/2010  . HLD (hyperlipidemia) 05/29/2008  . MACULAR DEGENERATION 05/29/2008  . Osteoarthritis 05/29/2008  . PALPITATIONS 05/29/2008   Past Medical History  Diagnosis Date  . Macular degeneration   . Hyperlipidemia   . Osteoporosis     osteoarthritis   Past Surgical History  Procedure Laterality  Date  . Appendectomy  1942  . Vaginal hysterectomy  1952  . Tonsillectomy  1928    ? Adenoids  . Inguinal hernia repair  1946    Left  . Cataract extraction  1999   History  Substance Use Topics  . Smoking status: Never Smoker   . Smokeless tobacco: Never Used  . Alcohol Use: 1.2 oz/week    2 Glasses of wine per week     Comment: WINE 5 DAYS PER WEEK   Family History  Problem Relation Age of Onset  . Cancer Mother     colon  . Heart disease Father    No Known Allergies Current Outpatient Prescriptions on File Prior to Visit  Medication Sig Dispense Refill  . lisinopril-hydrochlorothiazide (PRINZIDE,ZESTORETIC)  20-12.5 MG per tablet TAKE 1 TABLET EVERY DAY 90 tablet 0  . Probiotic Product (PROBIOTIC DAILY PO) Take by mouth daily.     No current facility-administered medications on file prior to visit.   The PMH, PSH, Social History, Family History, Medications, and allergies have been reviewed in [facility], and have been updated if relevant.  Review of Systems  Constitutional: Negative for appetite change.  HENT: Positive for postnasal drip.   Eyes: Negative for pain.  Respiratory: Positive for shortness of breath. Negative for chest tightness.   Cardiovascular: Negative.   Gastrointestinal: Negative.   Endocrine: Negative.   Genitourinary: Negative.   Musculoskeletal: Negative.   Skin: Negative.   Psychiatric/Behavioral: Negative.   All other systems reviewed and are negative.      Objective:    BP 158/86 mmHg  Pulse 108  Temp(Src) 97.5 F (36.4 C) (Oral)  Ht 5\' 1"  (1.549 m)  Wt 113 lb 12 oz (51.597 kg)  BMI 21.50 kg/m2  SpO2 96%   Physical Exam   General: alert and normal appearance.  HEENT:  TMs clear bilaterally, no cerumen Mouth: no erythema and no lesions.  Lungs: normal respiratory effort and normal breath sounds.  Heart:tachycardic, irregularly irregular III/VI SEM  Psych: normally interactive, good eye contact, not anxious appearing, and not depressed appearing.  Neuro- normal gait      Assessment & Plan:   Medicare annual wellness visit, subsequent  HLD (hyperlipidemia)  Essential hypertension, benign  Osteoarthritis of multiple joints, unspecified osteoarthritis type No Follow-up on file.

## 2014-12-28 NOTE — Assessment & Plan Note (Signed)
Improved

## 2014-12-28 NOTE — Assessment & Plan Note (Signed)
Followed by [facility].

## 2014-12-28 NOTE — Assessment & Plan Note (Signed)
The patients weight, height, BMI and visual acuity have been recorded in the chart I have made referrals, counseling and provided education to the patient based review of the above and I have provided the pt with a written personalized care plan for preventive services.  Prevnar 13 given today.

## 2014-12-28 NOTE — Assessment & Plan Note (Signed)
New- symptomatic. Discussed with pt and her son.  I do recommend that she go to the ER- we will call cardiology to discuss with cardiologist on call.

## 2014-12-28 NOTE — Addendum Note (Signed)
Addended by: Modena Nunnery on: 12/28/2014 04:35 PM   Modules accepted: Orders

## 2014-12-29 ENCOUNTER — Encounter: Payer: Self-pay | Admitting: Cardiovascular Disease

## 2014-12-29 ENCOUNTER — Ambulatory Visit (INDEPENDENT_AMBULATORY_CARE_PROVIDER_SITE_OTHER): Payer: Medicare Other | Admitting: Cardiovascular Disease

## 2014-12-29 VITALS — BP 128/60 | HR 72 | Ht 65.0 in | Wt 115.2 lb

## 2014-12-29 DIAGNOSIS — I4891 Unspecified atrial fibrillation: Secondary | ICD-10-CM

## 2014-12-29 DIAGNOSIS — I10 Essential (primary) hypertension: Secondary | ICD-10-CM

## 2014-12-29 DIAGNOSIS — I482 Chronic atrial fibrillation, unspecified: Secondary | ICD-10-CM

## 2014-12-29 MED ORDER — APIXABAN 2.5 MG PO TABS: 2.5000 mg | ORAL_TABLET | Freq: Two times a day (BID) | ORAL | Status: AC

## 2014-12-29 NOTE — Progress Notes (Signed)
Primary care physician: Dr. Deborra Medina  HPI  This is a 81 year old female who was referred for newly diagnosed atrial fibrillation. She has no previous cardiac history. She has noticed increased exertional dyspnea over the last few weeks. She saw her primary care physician yesterday and was noted to be in atrial fibrillation with rapid ventricular response. She was sent to the emergency room at [facility]. Basic labs were unremarkable.  Creatinine was 1.54 with a BUN of 35. Cardiac enzymes were negative. BNP was 402. She was switched from amlodipine to diltiazem extended release. She took the first dose today. She denies any chest discomfort no palpitations. No previous history of congestive heart failure or CVA. She has known history of hypertension and chronic kidney disease. She sees Dr. Holley Raring. No previous bleeding complications.  No Known Allergies   Current Outpatient Prescriptions on File Prior to Visit  Medication Sig Dispense Refill  . lisinopril-hydrochlorothiazide (PRINZIDE,ZESTORETIC) 20-12.5 MG per tablet TAKE 1 TABLET EVERY DAY 90 tablet 0  . Probiotic Product (PROBIOTIC DAILY PO) Take by mouth daily.    Marland Kitchen zoster vaccine live, PF, (ZOSTAVAX) 24401 UNT/0.65ML injection Inject 19,400 Units into the skin once. 1 each 0   No current facility-administered medications on file prior to visit.     Past Medical History  Diagnosis Date  . Macular degeneration   . Hyperlipidemia   . Osteoporosis     osteoarthritis  . A-fib   . Hypertension   . Heart murmur   . Kidney problem      Past Surgical History  Procedure Laterality Date  . Appendectomy  1942  . Vaginal hysterectomy  1952  . Tonsillectomy  1928    ? Adenoids  . Inguinal hernia repair  1946    Left  . Cataract extraction  1999     Family History  Problem Relation Age of Onset  . Cancer Mother     colon  . Heart disease Father      History   Social History  . Marital Status: Widowed    Spouse Name: N/A  .  Number of Children: 1  . Years of Education: N/A   Occupational History  . Housewife    Social History Main Topics  . Smoking status: Never Smoker   . Smokeless tobacco: Never Used  . Alcohol Use: 1.2 oz/week    2 Glasses of wine per week     Comment: WINE 5 DAYS PER WEEK  . Drug Use: No  . Sexual Activity: Not on file   Other Topics Concern  . Not on file   Social History Narrative   Has a living will   Son HPOA   DNR     ROS A 10 point review of system was performed. It is negative other than that mentioned in the history of present illness.   PHYSICAL EXAM   BP 128/60 mmHg  Pulse 72  Ht 5\' 5"  (1.651 m)  Wt 115 lb 4 oz (52.277 kg)  BMI 19.18 kg/m2 Constitutional: She is oriented to person, place, and time. She appears well-developed and well-nourished. No distress.  HENT: No nasal discharge.  Head: Normocephalic and atraumatic.  Eyes: Pupils are equal and round. No discharge.  Neck: Normal range of motion. Neck supple. No JVD present. No thyromegaly present.  Cardiovascular: Normal rate, irregular rhythm, normal heart sounds. Exam reveals no gallop and no friction rub. No murmur heard.  Pulmonary/Chest: Effort normal and breath sounds normal. No stridor. No respiratory distress. She has  no wheezes. She has no rales. She exhibits no tenderness.  Abdominal: Soft. Bowel sounds are normal. She exhibits no distension. There is no tenderness. There is no rebound and no guarding.  Musculoskeletal: Normal range of motion. She exhibits no edema and no tenderness.  Neurological: She is alert and oriented to person, place, and time. Coordination normal.  Skin: Skin is warm and dry. No rash noted. She is not diaphoretic. No erythema. No pallor.  Psychiatric: She has a normal mood and affect. Her behavior is normal. Judgment and thought content normal.     [id number] fibrillation  Low voltage in limb leads.   -Old anterior infarct.   -Nonspecific ST depression   +    Nonspecific T-abnormality  -Nondiagnostic.   ABNORMAL    ASSESSMENT AND PLAN

## 2014-12-29 NOTE — Patient Instructions (Signed)
Your physician has requested that you have an echocardiogram. Echocardiography is a painless test that uses sound waves to create images of your heart. It provides your doctor with information about the size and shape of your heart and how well your heart's chambers and valves are working. This procedure takes approximately one hour. There are no restrictions for this procedure.  Your physician has recommended you make the following change in your medication:  Start Eliquis 2.5 mg twice daily  Do not start until you speak to your eye doctor   Your physician recommends that you schedule a follow-up appointment in:  1 month

## 2014-12-30 ENCOUNTER — Encounter: Payer: Self-pay | Admitting: Cardiovascular Disease

## 2014-12-30 DIAGNOSIS — H3532 Exudative age-related macular degeneration: Secondary | ICD-10-CM | POA: Diagnosis not present

## 2014-12-30 NOTE — Assessment & Plan Note (Addendum)
The patient has newly diagnosed atrial fibrillation. Ventricular rate is now well controlled on diltiazem. CHADS VASc score is 4. I had a prolonged discussion with the patient and her son about the indications and risks of anticoagulation. After extensive discussion, we decided to start Eliquis 2.5 mg twice daily given her age and chronic kidney disease. She does have known history of macular degeneration and receives injections every 6 weeks. I asked her to check with her ophthalmologist about the need to hold anticoagulation. I requested an echocardiogram. Cardioversion can be considered  after 3-4 weeks of anticoagulation only if she remains symptomatic.

## 2014-12-30 NOTE — Assessment & Plan Note (Signed)
Blood pressure is well controlled on current medications. 

## 2015-01-25 ENCOUNTER — Ambulatory Visit (INDEPENDENT_AMBULATORY_CARE_PROVIDER_SITE_OTHER): Payer: Medicare Other

## 2015-01-25 ENCOUNTER — Other Ambulatory Visit: Payer: Self-pay

## 2015-01-25 DIAGNOSIS — I4891 Unspecified atrial fibrillation: Secondary | ICD-10-CM | POA: Diagnosis not present

## 2015-01-25 DIAGNOSIS — I482 Chronic atrial fibrillation: Secondary | ICD-10-CM

## 2015-01-26 NOTE — Consult Note (Signed)
Brief Consult Note: Diagnosis: GI bleeding.   Patient was seen by consultant.   Consult note dictated.   Comments: Appreciate consult for 81 y/o caucasian woman admitted with rectal bleeding: last stool was about 21minutes ago, and was dark red with clots per nursing staff. Patient states that her onset of rectal bleeding was yesterday. Evidentally was seen in the ED and outpatient follow up was planned, but she started bleeding again. Came back to ED with some dizziness, but denies all other complaints. Has had 4 or 5 red/marroon stools since admission.  On exam, abdomen is soft, nontender. There are bruits present superior to the umbilicus, and down the  left mid clavicular line. There is no pulsating. She does have a rather loud systolic murmur that radiates to both the axilla and neck. Impression and plan: rectal bleeding, possibly diverticular in nature. Will obtain stat GI bleeding scan. Vascular consult if positive. Continue to follow hemoglobins and transfuse prn. She is hemodynamically stable. Further recommendations to follow. Cardiac murmur may be flow related as her hemoglobin has dropped: last 8.3, vs. other cause..  Electronic Signatures: Stephens November H (NP)  (Signed 06-Aug-13 14:27)  Authored: Brief Consult Note   Last Updated: 06-Aug-13 14:27 by Theodore Demark (NP)

## 2015-01-26 NOTE — Consult Note (Signed)
PATIENT NAME:  Lindsey Cline, Lindsey Cline MR#:  [id number] DATE OF BIRTH:  1919-03-27  DATE OF CONSULTATION:  05/14/2012  REFERRING PHYSICIAN:   CONSULTING PHYSICIAN:  Harrell Gave A. Christabella Alvira, MD  CHIEF COMPLAINT: Bright red blood per rectum.  HISTORY OF PRESENT ILLNESS:  Lindsey Cline is a pleasant 81 year old female with minimal comorbidities who has a history of GI bleed in 2011 who presents with two days of bloody bowel movements. She initially presented yesterday to the ER after having a bloody bowel movement. She was discharged to home and continued to have persistent bowel movements. She was then admitted to the hospital and has continued to have bowel movements since then. Her hemoglobin has dropped during admission from 11.4 to 9 over approximately 18 hours. Her creatinine was initially elevated at 1.41. She was also lightheaded at that time.  She says that she has not had anymore episodes. In the past, she has had some subjective lightheadedness but no fevers, chills, night sweats, shortness of breath, abdominal pain, nausea, vomiting, diarrhea, constipation, or chest pain.  PAST MEDICAL HISTORY:  1. History of rectal bleed thought to be diverticular, in 2011. Did not receive colonoscopy or EGD at that time.  2. Hypertension. 3. History of acute renal insufficiency. 4. Hyperlipidemia. 5. Macular degeneration.  6. Diverticulosis.  7. Status post left inguinal hernia repair, greater than 60 years ago.  8. Status post hysterectomy and bilateral salpingo-oophorectomy, approximately 61 years ago. 9. History of appendectomy in 1941.  HOME MEDICATIONS: 1. PreserVision. 2. Lisinopril.  DRUG ALLERGIES: No known drug allergies.   SOCIAL HISTORY: Tobacco use - denies. Illegal drug use - denies. Alcohol - socially. Lives at [facility] and is accompanied by I believe her daughter.   PHYSICAL EXAMINATION:  VITALS: Currently temperature is 97.6, pulse 79, respirations 20, blood pressure 105/50, and  pulse oximetry 94% on room air.  GENERAL: No acute distress. Alert and oriented x3.   HEAD: Normocephalic, atraumatic.   NECK: Supple. No obvious masses.  ENT: No obvious facial trauma.  Ears, nose within normal limits  EYES: No scleral icterus. No conjunctivitis.  CHEST: Lungs are clear to auscultation. Unlabored breathing  HEART: Loud systolic murmur, regular rate and rhythm.   ABDOMEN: Soft, nontender, and nondistended. Has a lower midline incision from hysterectomy and a right McBurney's incision from appendectomy.   EXTREMITIES: Moves all extremities well.   RECTAL: No obvious hemorrhoids. Does have some blood staining around her anus.  LABORATORY, DIAGNOSTIC AND RADIOLOGIC DATA: Creatinine is 1.26 down from 1.41, BUN is 39 up from 37, and bicarbonate 25.   White blood cell count is 5.6, hemoglobin 9.0 down from 11.4 at noon yesterday, and platelets are 170.    INR was 0.9 on admission.   Urinalysis is positive for 1+ leukocyte esterase and 5 white blood cells.  ASSESSMENT AND PLAN: Lindsey Cline is a pleasant 81 year old with a history of GI bleed that was not adequately localized in the past, but resolved spontaneous, who now is having multiple bowel movements and a drop in hemoglobin and hematocrit. Would recommend strict ins and outs to determine how much this is dilutional and would recommend gastroenterology consultation for colonoscopy. Would consider upper EGD also as we are not certain this is lower despite the fact that she has history of diverticulosis. Would recommend tagged red blood cell scan and vascular surgery consultation if continues to drop. We will continue to follow with you.  ____________________________ Glena Norfolk. Dandra Shambaugh, MD cal:slb D: 05/14/2012 09:16:27 ET T:  05/14/2012 11:08:56 ET JOB#: [id number]  cc: Harrell Gave A. Ledger Heindl, MD, <Dictator> Floyde Parkins MD ELECTRONICALLY SIGNED 05/15/2012 20:08

## 2015-01-26 NOTE — Consult Note (Signed)
PATIENT NAME:  Lindsey Cline, Lindsey Cline MR#:  [id number] DATE OF BIRTH:  12/13/1918  DATE OF CONSULTATION:  05/14/2012  CONSULTING PHYSICIAN:  Loistine Simas, MD/Khyan Oats Orrin Brigham, NP  REASON FOR CONSULTATION: Ms. Hinks is a pleasant 81 year old Caucasian woman with a history of diverticulosis, diverticular bleeding in the past, hypertension, hyperlipidemia and macular degeneration. GI was consulted at the request of Dr. Bridgette Habermann to evaluate rectal bleeding. The patient states onset of rectal bleeding was yesterday. She was evidently seen in the Emergency Department, stopped bleeding and was to follow up as outpatient; however, she started having dark red bleeding from the rectum again and came back to the Emergency Department. She had some dizziness at that time and was subsequently admitted. She states that she has had 4 or 5 red/maroon stools since admission, the last being 20 minutes ago. She noticed that her hemoglobin has decreased to 8.3 since her admit hemoglobin of 11.2. She denies all other GI-related complaints and states she has never had a colonoscopy or EGD but did have a flexible sigmoidoscopy at one time and does not remember the findings.   PAST MEDICAL HISTORY:  1. Hypertension.  2. Hyperlipidemia.  3. Macular degeneration. 4. Diverticulosis with diverticular bleed in 2011.  5. Spinal stenosis.  PAST SURGICAL HISTORY:  1. Left inguinal hernia repair.  2. Status post hysterectomy.  3. Status post appendectomy.   MEDICATIONS:  1. PreserVision 1 b.i.d.  2. Lisinopril 2.5 mg, 1 p.o. daily.    ALLERGIES: No known drug allergies.   SOCIAL HISTORY: Lives at independent living at [facility]. No tobacco, illicits, rare wine with dinner. She is widowed.   FAMILY HISTORY: Pertinent for mother with colorectal cancer, negative for liver disease, ulcers, and other GI issues. Pertinent for hypertension and coronary artery disease.   REVIEW OF SYSTEMS: A ten-point review is negative with  the exception of lower back discomfort and occasional tingling to legs which she attributes to her spinal stenosis. States she has had an epidural steroid injection at one point several years ago that was beneficial for her.   LABORATORY, DIAGNOSTIC AND RADIOLOGICAL DATA: Most recent labs: Glucose 92, BUN 39, creatinine 1.26, sodium 143, potassium 4.2, chloride 111, GFR 37, calcium 8, total protein 3.1, albumin 3.7, bilirubin 0.4, ALP 71, AST 20, ALT 14. WBC 5.6, most recent hemoglobin 8.3, platelet count 170, red cells are normocytic with increased RDW. PT 12.2, INR 0.9.   PHYSICAL EXAMINATION:  VITAL SIGNS: 97.3, 101, 20, 112/63, oxygen saturation 95% on room air.   GENERAL: Elderly Caucasian woman lying in bed in no obvious distress.   PSYCHIATRIC: Mood and stable, pleasant, logical thought, good memory.   HEENT: Normocephalic, atraumatic. No redness, drainage, or inflammation to the eyes or the nares. Mucous membranes are pink and moist.   NECK: Supple. No abnormalities.   CARDIOVASCULAR: S1, S2. Regular rate and rhythm. Grade 3/6 systolic murmur to all auscultation points. This radiates to the axilla and to the neck. Peripheral pulses are 2+. No appreciable edema. No gout.   RESPIRATORY: Respirations eupneic. Lungs are clear to auscultation bilaterally.   ABDOMEN: Soft, nondistended. Bowel sounds x4. I did note bruit over umbilicus and bruits down the left mid clavicular line on the left side of the abdomen. I wonder if this is related to her radiation of her cardiac murmur. Nontender, no appreciable mass, hepatosplenomegaly, peritoneal signs, rebound, tenderness, guarding.   EXTREMITIES: Moves all extremities well  x4. Sensation appears to be intact. Strength 5/5. No clubbing  or cyanosis.   RECTAL: Deferred.   NEUROLOGICAL: Cranial nerves II through XII are grossly intact. Strength 5/5. Speech clear. No facial droop.   SKIN: No erythema, lesion or rash.   IMPRESSION AND PLAN:  Rectal bleeding, possibly diverticular in nature. We will obtain stat GI bleeding scan, Vascular consult if positive. Continue to follow hemoglobin and transfuse p.r.n. She is hemodynamically stable. Further recommendations to follow. Cardiac murmur may be flow related as her hemoglobin has dropped, last 8.3 versus other cause. Additionally, since writing the above, the patient has undergone negative GI bleeding scan. She has not had any recent bleeding. We will consult Vascular if she rebleeds again. I changed her pantoprazole to 40 mg IV b.i.d. I will transfuse 1 unit, repeat hemoglobin afterwards. Further recommendations to follow.   These services were provided by Stephens November, MSN, [place] in collaboration with Lollie Sails, MD.  ____________________________ Theodore Demark, NP chl:cbb D: 05/14/2012 17:42:05 ET T: 05/14/2012 18:44:16 ET JOB#: [id number]  cc: Theodore Demark, NP, <Dictator> [place] SIGNED 05/15/2012 15:17

## 2015-01-26 NOTE — Consult Note (Signed)
Chief Complaint:   Subjective/Chief Complaint patient seen and examined, please see full GI consult .  patient admitted with hematochezia.  Several episodes fo marroon stools today.  GI bleeding scan done and negative.  To my DRE tonight, thin watery dark marroon effluent.  No abdominal pain, no nsaids.  Patietn to get a unit ofprbc tonight.  will recheck hgb after. May need repeat scan in am if positive.   VITAL SIGNS/ANCILLARY NOTES: **Vital Signs.:   06-Aug-13 20:19   Vital Signs Type Routine   Temperature Temperature (F) 98.3   Celsius 36.8   Temperature Source Oral   Pulse Pulse 85   Respirations Respirations 20   Systolic BP Systolic BP 92   Diastolic BP (mmHg) Diastolic BP (mmHg) 50   Mean BP 64   Pulse Ox % Pulse Ox % 94   Pulse Ox Activity Level  At rest   Oxygen Delivery Room Air/ 21 %   Electronic Signatures: Loistine Simas (MD)  (Signed 06-Aug-13 20:46)  Authored: Chief Complaint, VITAL SIGNS/ANCILLARY NOTES   Last Updated: 06-Aug-13 20:46 by Loistine Simas (MD)

## 2015-01-26 NOTE — Discharge Summary (Signed)
PATIENT NAME:  Lindsey Cline, Lindsey Cline MR#:  [id number] DATE OF BIRTH:  [birth year]  DATE OF ADMISSION:  05/13/2012 DATE OF DISCHARGE:  05/16/2012   For a detailed note, please take a look at the history and physical done on admission by Dr. Bridgette Habermann.    DIAGNOSES AT DISCHARGE:  1. Acute lower bleed likely secondary to diverticulosis. 2. Anemia secondary to lower GI bleed. 3. Hypertension.  4. Acute renal failure, now resolved.   DIET: The patient is being discharged on a low sodium, low residue diet.   ACTIVITY: As tolerated.  FOLLOW-UP:  1. Follow-up with Dr. Viviana Simpler in the next 1 to 2 weeks. 2. Follow-up with Dr. Loistine Simas from Gastroenterology in the next 1 to 2 weeks.  DISCHARGE REFERRAL: The patient is being referred for physical therapy services at the skilled nursing facility.   DISCHARGE MEDICATIONS:  1. PreserVision 1 tab b.i.d.  2. Lisinopril 2.5 mg daily.   [place] COURSE:  1. Dr. Loistine Simas from Gastroenterology  2. Dr. Marlyce Huge from [place] COURSE: Nuclear Medicine GI bleeding scan showing no scintigraphic evidence of acute hemorrhage in the bowel lumen.   HOSPITAL COURSE: This is a 81 year old female who presented to the hospital on 05/13/2012 secondary to multiple episodes of bright red blood per rectum.  1. Acute lower GI bleed. The patient presented to the hospital with bright red blood per rectum likely secondary to a diverticular GI bleed. The patient has a previous history of diverticulosis and diverticular GI bleed in the past. She was given IV fluids. Her hemoglobin was observed every six hours. It did not significantly drop. Her bleeding stopped by itself. She was not placed on any anticoagulants while in the hospital course. A surgical consult and a GI consult was obtained. As per Surgery, she did not require any acute surgical intervention. Her bleeding scan  was normal. Her diet was slowly advanced from a clear liquid eventually to a low residue diet which she has been able to tolerate with no evidence of any further bleeding. She likely will need close follow-up with GI as an outpatient and should avoid any anticoagulants presently. Therefore, she is being discharged home.  2. Acute renal failure. This was likely secondary to volume loss from the blood loss. After getting IV fluids the patient's renal function has completely come down to normal. Her ACE inhibitor was initially held but it can be resumed now upon discharge.  3. Hypertension. The patient's blood pressure meds were held due to GI bleed and hypotension, although she can resume those upon discharge now.  4. Anemia. This was likely secondary to the GI bleed. As mentioned, the patient did not require any transfusions. Her GI bleed since then has now resolved and stopped.   CODE STATUS: The patient is a DO NOT INTUBATE, DO NOT RESUSCITATE.   DISPOSITION: She is being discharged back to a skilled nursing facility.   TIME SPENT: 40 minutes.   ____________________________ Belia Heman. Verdell Carmine, MD vjs:drc D: 05/16/2012 11:44:39 ET T: 05/16/2012 12:00:02 ET JOB#: [id number]  cc: Belia Heman. Verdell Carmine, MD, <Dictator> Venia Carbon, MD Lollie Sails, MD Henreitta Leber MD ELECTRONICALLY SIGNED 05/17/2012 16:14

## 2015-01-26 NOTE — H&P (Signed)
PATIENT NAME:  Lindsey Cline, Lindsey Cline MR#:  [id number] DATE OF BIRTH:  [birth year]  DATE OF ADMISSION:  05/13/2012  REFERRING PHYSICIAN: Dr. Jimmye Norman   PRIMARY CARE PHYSICIAN: Dr. Silvio Pate    CHIEF COMPLAINT: Lower GI bleed.  HISTORY OF PRESENT ILLNESS: The patient is a pleasant 81 year old Caucasian female with history of diverticulosis, history of diverticular bleed in the past, hypertension, hyperlipidemia, and macular degeneration who lives in independent living facility at [facility] who presents with the above chief complaint. The patient had two episodes of lower GI bleed as of earlier this morning. She came to the ER, was hemodynamically stable. She was noted to have a hemoglobin of 11.4. At that time care was taken to make an appointment for follow-up as bleed had stopped. The episodes were within 24 hours prior to initial ER visit. An appointment was made for Dr. Vira Agar on Friday and the patient was discharged and instructed to come back if she were to have ongoing bleed. The patient did have another episode of bleed where she was in the bathroom and had a large episode of bowel movement, bright red blood per rectum, and transiently felt dizzy and weak. She presented back to the ER. Her blood pressure has been stable. She is not tachycardic. Heart rate is about 80. Hospitalist service was contacted for further evaluation and management.   PAST MEDICAL HISTORY:  1. Hypertension.  2. Hyperlipidemia.  3. Macular degeneration.  4. Diverticulosis with a diverticular bleed in 2011.   PAST SURGICAL HISTORY:  1. Left inguinal hernia repair. 2. Status post hysterectomy.  3. Status post appendectomy.   MEDICATIONS:  1. PreserVision one 2 times a day one drop. 2. Lisinopril 2.5 mg 1 tab daily.   ALLERGIES: No known drug allergies.   SOCIAL HISTORY: No tobacco. Not using drugs. Does drink a glass of wine with dinner. Lives at [facility] [address] independent living facility. Is widowed.   FAMILY HISTORY:  Positive for hypertension and coronary artery disease.   REVIEW OF SYSTEMS: CONSTITUTIONAL: No fever or weight changes. EYES: Has macular degeneration. ENT: No tinnitus or hearing loss. RESPIRATORY: No cough, wheeze, hemoptysis, or COPD. CARDIOVASCULAR: No chest pain, palpitations, edema, or arrhythmia. GI: No nausea, vomiting, diarrhea, or abdominal pain. No hematemesis. Positive for three episodes of bright red blood per rectum. No abdominal pain. GU: No dysuria or incontinence. ENDOCRINE: No polyuria or nocturia. HEME/LYMPH: No easy bruising. Positive for bleeding as above. SKIN: No new rashes. MUSCULOSKELETAL: No arthritis. NEUROLOGIC: Denies numbness, weakness focally. PSYCH: Denies anxiety or insomnia.   PHYSICAL EXAMINATION:   VITAL SIGNS: Temperature on arrival 97.7, last pulse was 79, respiratory rate 20, blood pressure on arrival 132/63, last one 148/59, oxygen sat 97% on room air.   GENERAL: The patient is an elderly female sitting in bed in no obvious distress talking in full sentences.   HEENT: Normocephalic, atraumatic. Pupils are equal and reactive. Anicteric sclerae. Extraocular muscles intact. Moist mucous membranes.   NECK: Supple. No thyroid tenderness.   CARDIOVASCULAR: S1, S2. Positive for murmurs in mitral as well as aortic regions.   LUNGS: Clear to auscultation without wheezing or rhonchi.   ABDOMEN: Soft, nontender, nondistended. Hyperactive bowel sounds in all quadrants.   EXTREMITIES: No significant lower extremity edema.   NEUROLOGICAL: Cranial nerves II through XII grossly intact. Strength is 5/5 in all extremities.   SKIN: No obvious rashes.   PSYCH: Pleasant cooperative. Awake, alert, oriented x3.   LABORATORY, DIAGNOSTIC, AND RADIOLOGICAL DATA: Glucose 117, BUN  39. It was 21 in November 2011. Creatinine 1.41. It was 1.14 in November 2011. Chloride 108. GFR 33. LFTs within normal limits. Hemoglobin 11.4, hematocrit 33.2, WBC 7.3, platelets 215. INR 0.9.  Urinalysis 2+ blood, no nitrites, 1+ leukocyte esterase, 5 WBCs, trace bacteria.   ASSESSMENT AND PLAN: We have a pleasant 81 year old Caucasian female with history of diverticulosis with history of diverticular bleed in 2011, hypertension, hyperlipidemia, and macular degeneration living at [facility] who presents with lower GI bleed which is painless, likely diverticular.  1. Lower GI bleed. At this point we would admit the patient to the hospital. The patient was initially discharged from the ER earlier in the day with a diagnosis of diverticular bleed and had an appointment with GI. At this point given third episode of bleeding we would obtain a GI consult here. We would follow the hemoglobin q.8 hours. I have already consented the patient for blood transfusion and explained the risks and benefits. She has signed the consent. She appears hemodynamically stable without tachycardia. Although she did have a bout of dizziness and weakness after her third bleed, she is asymptomatic now. If she continues to bleed, we would consider a bleeding scan and surgical consult. However, at this point she has no symptoms. Hemoglobin as of earlier today was 11. We would follow her hemoglobins. There is no acute indication for transfusion now, however.  2. Hypertension. We would hold her lisinopril.  3. Renal failure. The patient does appear to have GFR that indicates she is in stage III renal disease chronically. Now, however, BUN and creatinine is slightly higher. Would monitor her kidney function and start her on very small gentle IV fluid dosage.  4. Would start her on SCDs and TEDs for DVT prophylaxis and start her on clears for now.   CODE STATUS: The patient is DO NOT RESUSCITATE.        TOTAL TIME SPENT: 60 minutes.   ____________________________ Vivien Presto, MD sa:drc D: 05/13/2012 21:45:36 ET T: 05/14/2012 08:03:44 ET JOB#: [id number]  cc: Vivien Presto, MD, <Dictator> Venia Carbon,  MD Vivien Presto MD ELECTRONICALLY SIGNED 05/31/2012 15:43

## 2015-01-26 NOTE — Consult Note (Signed)
Chief Complaint:   Subjective/Chief Complaint small stool this am, no evidence of active bleeding.   VITAL SIGNS/ANCILLARY NOTES: **Vital Signs.:   07-Aug-13 14:03   Vital Signs Type Routine   Temperature Temperature (F) 98.1   Celsius 36.7   Temperature Source oral   Pulse Pulse 85   Respirations Respirations 18   Pulse Ox % Pulse Ox % 95   Oxygen Delivery Room Air/ 21 %    14:09   Vital Signs Type Routine   Systolic BP Systolic BP 123XX123   Diastolic BP (mmHg) Diastolic BP (mmHg) 62   Mean BP 83   BP Source  if not from Vital Sign Device manual  *Intake and Output.:   07-Aug-13 11:07   Stool  Pt. Passed small clump of small clots with a few loose clots.   Brief Assessment:   Cardiac Irregular    Respiratory clear BS    Gastrointestinal details normal Soft  Nontender  Nondistended  No masses palpable  Bowel sounds normal   Lab Results: Routine Hem:  06-Aug-13 13:14    Hemoglobin (CBC)  8.3 (Result(s) reported on 14 May 2012 at 01:42PM.)  07-Aug-13 06:00    Hemoglobin (CBC)  8.6 (Result(s) reported on 15 May 2012 at [facility].)   Assessment/Plan:  Assessment/Plan:   Assessment 1) lower GI bleeding, stable, not recurrent over 24 hours.    Plan 1) discussed with Dr Verdell Carmine.  advance diet to full liquids, low residue tomorrow.  GI o/p fu in 2-3 weeks, will arrange for non-invasive testing (CT colonography) as outpatient.   Electronic Signatures: Loistine Simas (MD)  (Signed 07-Aug-13 16:51)  Authored: Chief Complaint, VITAL SIGNS/ANCILLARY NOTES, Brief Assessment, Lab Results, Assessment/Plan   Last Updated: 07-Aug-13 16:51 by Loistine Simas (MD)

## 2015-01-29 ENCOUNTER — Encounter: Payer: Self-pay | Admitting: Cardiovascular Disease

## 2015-01-29 ENCOUNTER — Ambulatory Visit (INDEPENDENT_AMBULATORY_CARE_PROVIDER_SITE_OTHER): Payer: Medicare Other | Admitting: Cardiovascular Disease

## 2015-01-29 ENCOUNTER — Telehealth: Payer: Self-pay | Admitting: Family Medicine

## 2015-01-29 VITALS — BP 92/60 | HR 116 | Ht 65.0 in | Wt 114.8 lb

## 2015-01-29 DIAGNOSIS — R0602 Shortness of breath: Secondary | ICD-10-CM | POA: Diagnosis not present

## 2015-01-29 DIAGNOSIS — I10 Essential (primary) hypertension: Secondary | ICD-10-CM

## 2015-01-29 DIAGNOSIS — I340 Nonrheumatic mitral (valve) insufficiency: Secondary | ICD-10-CM

## 2015-01-29 DIAGNOSIS — I482 Chronic atrial fibrillation, unspecified: Secondary | ICD-10-CM

## 2015-01-29 MED ORDER — METOPROLOL TARTRATE 25 MG PO TABS: 25.0000 mg | ORAL_TABLET | Freq: Two times a day (BID) | ORAL | Status: AC

## 2015-01-29 NOTE — Progress Notes (Signed)
Primary care physician: Dr. Deborra Medina  HPI  This is a 81 year old female who is here today for a follow-up visit regarding atrial fibrillation. She has no previous cardiac history. She was seen recently for atrial fibrillation with rapid ventricular response. She was sent to the emergency room at [facility]. Basic labs were unremarkable.  Creatinine was 1.54 with a BUN of 35. Cardiac enzymes were negative. BNP was 402. She was switched from amlodipine to diltiazem extended release. After extensive discussion, we decided to start anticoagulation with low dose Eliquis. She has been tolerating this. Echocardiogram showed normal LV systolic function, moderate mitral regurgitation, severely dilated left atrium and mildly dilated right atrium. She continues to complain of increased exertional dyspnea.   No Known Allergies   Current Outpatient Prescriptions on File Prior to Visit  Medication Sig Dispense Refill  . apixaban (ELIQUIS) 2.5 MG TABS tablet Take 1 tablet (2.5 mg total) by mouth 2 (two) times daily. 60 tablet 6  . diltiazem (CARDIZEM CD) 120 MG 24 hr capsule Take 120 mg by mouth daily.    Marland Kitchen lisinopril-hydrochlorothiazide (PRINZIDE,ZESTORETIC) 20-12.5 MG per tablet TAKE 1 TABLET EVERY DAY 90 tablet 0  . Probiotic Product (PROBIOTIC DAILY PO) Take by mouth daily.    Marland Kitchen zoster vaccine live, PF, (ZOSTAVAX) 60454 UNT/0.65ML injection Inject 19,400 Units into the skin once. 1 each 0   No current facility-administered medications on file prior to visit.     Past Medical History  Diagnosis Date  . Macular degeneration   . Hyperlipidemia   . Osteoporosis     osteoarthritis  . A-fib   . Hypertension   . Heart murmur   . Kidney problem      Past Surgical History  Procedure Laterality Date  . Appendectomy  1942  . Vaginal hysterectomy  1952  . Tonsillectomy  1928    ? Adenoids  . Inguinal hernia repair  1946    Left  . Cataract extraction  1999     Family History  Problem Relation Age  of Onset  . Cancer Mother     colon  . Heart disease Father      History   Social History  . Marital Status: Widowed    Spouse Name: N/A  . Number of Children: 1  . Years of Education: N/A   Occupational History  . Housewife    Social History Main Topics  . Smoking status: Never Smoker   . Smokeless tobacco: Never Used  . Alcohol Use: 1.2 oz/week    2 Glasses of wine per week     Comment: WINE 5 DAYS PER WEEK  . Drug Use: No  . Sexual Activity: Not on file   Other Topics Concern  . Not on file   Social History Narrative   Has a living will   Son HPOA   DNR     ROS A 10 point review of system was performed. It is negative other than that mentioned in the history of present illness.   PHYSICAL EXAM   BP 92/60 mmHg  Pulse 116  Ht 5\' 5"  (1.651 m)  Wt 114 lb 12.8 oz (52.073 kg)  BMI 19.10 kg/m2 Constitutional: She is oriented to person, place, and time. She appears well-developed and well-nourished. No distress.  HENT: No nasal discharge.  Head: Normocephalic and atraumatic.  Eyes: Pupils are equal and round. No discharge.  Neck: Normal range of motion. Neck supple. No JVD present. No thyromegaly present.  Cardiovascular: Normal rate, irregular rhythm,  normal heart sounds. Exam reveals no gallop and no friction rub. No murmur heard.  Pulmonary/Chest: Effort normal and breath sounds normal. No stridor. No respiratory distress. She has no wheezes. She has no rales. She exhibits no tenderness.  Abdominal: Soft. Bowel sounds are normal. She exhibits no distension. There is no tenderness. There is no rebound and no guarding.  Musculoskeletal: Normal range of motion. She exhibits no edema and no tenderness.  Neurological: She is alert and oriented to person, place, and time. Coordination normal.  Skin: Skin is warm and dry. No rash noted. She is not diaphoretic. No erythema. No pallor.  Psychiatric: She has a normal mood and affect. Her behavior is normal. Judgment  and thought content normal.     [id number] fibrillation  Low voltage in limb leads.   -Old anterior infarct.   -Nonspecific ST depression   +   Nonspecific T-abnormality  -Nondiagnostic. Nonspecific IVCD  ABNORMAL    ASSESSMENT AND PLAN

## 2015-01-29 NOTE — Telephone Encounter (Signed)
Son dropped off medical certification letter from [facility].  Pt is moving from independent to assisted living. Pt is also going to be applying for Long term care insurance as well.  Please call son when ready or if you have questions at [phone]. Thanks. Placing on [employer]

## 2015-01-29 NOTE — Patient Instructions (Addendum)
Stop Diltiazem 120 mg.  Start Metoprolol Tartrate 25 mg Take 1 tablet by mouth twice daily.  Your physician recommends that you schedule a follow-up appointment in: 2 months with Dr. Fletcher Anon

## 2015-01-29 NOTE — Assessment & Plan Note (Signed)
I suspect that this is chronic at this point especially with severely dilated left atrium. I recommend rate control and anticoagulation as long as tolerated. Ventricular rate does not seem to be well-controlled especially with activities. Thus, I discontinued diltiazem and started metoprolol 25 mg twice daily. This can be uptitrated if needed with decreasing the dose of other antihypertensive medications to allow this.

## 2015-01-29 NOTE — Telephone Encounter (Signed)
Form placed in Dr Hulen Shouts inbox for review and completion

## 2015-01-29 NOTE — Assessment & Plan Note (Signed)
Blood pressure is on the low side. I would consider decreasing the dose of lisinopril-hydrochlorothiazide in order to allow up titration of rate control.

## 2015-01-29 NOTE — Assessment & Plan Note (Signed)
This was moderate by echo.

## 2015-02-01 ENCOUNTER — Ambulatory Visit (INDEPENDENT_AMBULATORY_CARE_PROVIDER_SITE_OTHER): Payer: Medicare Other | Admitting: Podiatry

## 2015-02-01 DIAGNOSIS — B351 Tinea unguium: Secondary | ICD-10-CM

## 2015-02-01 DIAGNOSIS — M79676 Pain in unspecified toe(s): Secondary | ICD-10-CM | POA: Diagnosis not present

## 2015-02-01 NOTE — Progress Notes (Signed)
She presents today with chief complaint of painful toenails 1 through 5 bilateral.  Objective: Nails are thick yellow dystrophic with mycotic and painful palpation 1 through 5 bilateral.  Assessment: Pain in limb standard onychomycosis 1 through 5 bilateral.  Plan: Debridement of nails 1 through 5 bilateral covered service pain.

## 2015-02-03 ENCOUNTER — Other Ambulatory Visit: Payer: Self-pay | Admitting: *Deleted

## 2015-02-03 ENCOUNTER — Telehealth: Payer: Self-pay | Admitting: *Deleted

## 2015-02-03 NOTE — Telephone Encounter (Signed)
Spoke to pts son who states that pt was Dx with Afib and is still unsteady. Pt recently had medication change to help with heart conditioning. He and his wife have also noticed that pt is having trouble with short term memory and they are afraid she will forget to take her meds. Clair Gulling states he is able to speak with Dr Deborra Medina directly, or if pt is needing an additional OV, he is able to do so.

## 2015-02-03 NOTE — Telephone Encounter (Signed)
Patient's father, Rosalene Wenzlick, calling to check status of forms from [facility] for Assisted Living.  He requests call back with update.

## 2015-02-03 NOTE — Telephone Encounter (Signed)
Form completed.

## 2015-02-03 NOTE — Telephone Encounter (Signed)
Form placed in Lindsey Cline's box yesterday- need more information in terms of her current functional status -when I saw her in march she was having unsteady gait which is why she was considering ALF.  Has anything changed or worsened?  Recent falls?  Incontinence?

## 2015-02-03 NOTE — Telephone Encounter (Signed)
Spoke to pts son and informed him form is available for pickup from the front desk. Copy sent for scanning.

## 2015-02-15 ENCOUNTER — Ambulatory Visit: Payer: Medicare Other

## 2015-02-15 DIAGNOSIS — R809 Proteinuria, unspecified: Secondary | ICD-10-CM | POA: Diagnosis not present

## 2015-02-15 DIAGNOSIS — N2581 Secondary hyperparathyroidism of renal origin: Secondary | ICD-10-CM | POA: Diagnosis not present

## 2015-02-15 DIAGNOSIS — N183 Chronic kidney disease, stage 3 (moderate): Secondary | ICD-10-CM | POA: Diagnosis not present

## 2015-02-15 DIAGNOSIS — I10 Essential (primary) hypertension: Secondary | ICD-10-CM | POA: Diagnosis not present

## 2015-02-22 DIAGNOSIS — H3532 Exudative age-related macular degeneration: Secondary | ICD-10-CM | POA: Diagnosis not present

## 2015-02-22 DIAGNOSIS — H3531 Nonexudative age-related macular degeneration: Secondary | ICD-10-CM | POA: Diagnosis not present

## 2015-02-25 DIAGNOSIS — I10 Essential (primary) hypertension: Secondary | ICD-10-CM | POA: Diagnosis not present

## 2015-02-25 DIAGNOSIS — R809 Proteinuria, unspecified: Secondary | ICD-10-CM | POA: Diagnosis not present

## 2015-02-25 DIAGNOSIS — N183 Chronic kidney disease, stage 3 (moderate): Secondary | ICD-10-CM | POA: Diagnosis not present

## 2015-02-28 ENCOUNTER — Other Ambulatory Visit: Payer: Self-pay | Admitting: Family Medicine

## 2015-03-10 DIAGNOSIS — Z7689 Persons encountering health services in other specified circumstances: Secondary | ICD-10-CM

## 2015-03-16 ENCOUNTER — Telehealth: Payer: Self-pay | Admitting: Family Medicine

## 2015-03-16 NOTE — Telephone Encounter (Signed)
Returned Lindsey Cline call. Spoke about his recent concerns about [name]'s memory loss.  He will call front desk to set up an appointment.

## 2015-03-16 NOTE — Telephone Encounter (Signed)
Pt's son Suriah Duro, would like to have a conversation with Dr. Deborra Medina to discuss memory loss prior to scheduling an appointment for his mother.  He has previously spoken with Leafy Ro, head nurse at the [facility] assisted living facility.  Best number to call Brettney Sanderlin is [phone] / lt

## 2015-03-22 ENCOUNTER — Ambulatory Visit (INDEPENDENT_AMBULATORY_CARE_PROVIDER_SITE_OTHER): Payer: Medicare Other | Admitting: Family Medicine

## 2015-03-22 ENCOUNTER — Encounter: Payer: Self-pay | Admitting: Family Medicine

## 2015-03-22 VITALS — BP 104/70 | HR 69 | Temp 97.7°F | Wt 114.5 lb

## 2015-03-22 DIAGNOSIS — N183 Chronic kidney disease, stage 3 (moderate): Secondary | ICD-10-CM

## 2015-03-22 DIAGNOSIS — R413 Other amnesia: Secondary | ICD-10-CM | POA: Diagnosis not present

## 2015-03-22 DIAGNOSIS — I4819 Other persistent atrial fibrillation: Secondary | ICD-10-CM

## 2015-03-22 DIAGNOSIS — I481 Persistent atrial fibrillation: Secondary | ICD-10-CM

## 2015-03-22 LAB — CBC WITH DIFFERENTIAL/PLATELET
Basophils Absolute: 0 10*3/uL (ref 0.0–0.1)
Basophils Relative: 0.6 % (ref 0.0–3.0)
Eosinophils Absolute: 0.1 10*3/uL (ref 0.0–0.7)
Eosinophils Relative: 1.6 % (ref 0.0–5.0)
HCT: 37.3 % (ref 36.0–46.0)
HEMOGLOBIN: 12.2 g/dL (ref 12.0–15.0)
LYMPHS PCT: 24.9 % (ref 12.0–46.0)
Lymphs Abs: 1.5 10*3/uL (ref 0.7–4.0)
MCHC: 32.7 g/dL (ref 30.0–36.0)
MCV: 99.6 fl (ref 78.0–100.0)
Monocytes Absolute: 0.5 10*3/uL (ref 0.1–1.0)
Monocytes Relative: 7.5 % (ref 3.0–12.0)
NEUTROS PCT: 65.4 % (ref 43.0–77.0)
Neutro Abs: 3.9 10*3/uL (ref 1.4–7.7)
Platelets: 254 10*3/uL (ref 150.0–400.0)
RBC: 3.75 Mil/uL — AB (ref 3.87–5.11)
RDW: 14.7 % (ref 11.5–15.5)
WBC: 6 10*3/uL (ref 4.0–10.5)

## 2015-03-22 LAB — VITAMIN D 25 HYDROXY (VIT D DEFICIENCY, FRACTURES): VITD: 24.22 ng/mL — ABNORMAL LOW (ref 30.00–100.00)

## 2015-03-22 LAB — COMPREHENSIVE METABOLIC PANEL
ALK PHOS: 64 U/L (ref 39–117)
ALT: 13 U/L (ref 0–35)
AST: 20 U/L (ref 0–37)
Albumin: 4.1 g/dL (ref 3.5–5.2)
BILIRUBIN TOTAL: 0.4 mg/dL (ref 0.2–1.2)
BUN: 39 mg/dL — ABNORMAL HIGH (ref 6–23)
CO2: 27 mEq/L (ref 19–32)
CREATININE: 1.48 mg/dL — AB (ref 0.40–1.20)
Calcium: 9.1 mg/dL (ref 8.4–10.5)
Chloride: 104 mEq/L (ref 96–112)
GFR: 34.93 mL/min — AB (ref >60.00)
Glucose, Bld: 100 mg/dL — ABNORMAL HIGH (ref 70–99)
POTASSIUM: 4.4 meq/L (ref 3.5–5.1)
Sodium: 139 mEq/L (ref 135–145)
TOTAL PROTEIN: 6.9 g/dL (ref 6.0–8.3)

## 2015-03-22 LAB — T4, FREE: Free T4: 0.88 ng/dL (ref 0.60–1.60)

## 2015-03-22 LAB — VITAMIN B12: VITAMIN B 12: 256 pg/mL (ref 211–911)

## 2015-03-22 LAB — TSH: TSH: 1.64 u[IU]/mL (ref 0.35–4.50)

## 2015-03-22 NOTE — Progress Notes (Signed)
Pre visit review using our clinic review tool, if applicable. No additional management support is needed unless otherwise documented below in the visit note. 

## 2015-03-22 NOTE — Progress Notes (Signed)
Subjective:   Patient ID: Lindsey Cline, female    DOB: [birth year], 81 y.o.   MRN: [id number]  Lindsey Cline is a pleasant 81 y.o. year old female who presents to clinic today with her son and daughter in low for  Follow-up  on 03/22/2015  HPI:  Recently moved to ALF.    Received the following email from Leticia Penna, Omer at [facility]:  [name] son just left my office. Apparently, he reports noting some further cognitive decline over the weekend and is worried about his mom. Before her admission, I informed him to expect a possible change/decline after going through the stress of a move and that we were concerned with that because of her general condition and fragility. I strongly recommended that they set up a follow up appointment with you, especially given all of the changes she has had in past few months. Son is going to call your office tomorrow and set up an appointment. Just wanted to keep you in the loop! Things may settle down with her as she acclimates to the routine here in [place]. She currently is more confused but very happy and has been managing..she's just needing some extra support (more than I think the son expected).   I then spoke with her son on 03/16/2015.  He felt overall she was adjusting ok but seems to be forgetting things he has told her which is not like her.    She likes her neighbors and has enjoyed eating in the dining room for all three meals.  She feels she is not forgetting things but her son and daughter in law do--"never anything major." He did think she was more concerned about money which worried him "this is a change in her personality."  SOB has improved now that she is rate controlled for new afib. Current Outpatient Prescriptions on File Prior to Visit  Medication Sig Dispense Refill  . apixaban (ELIQUIS) 2.5 MG TABS tablet Take 1 tablet (2.5 mg total) by mouth 2 (two) times daily. 60 tablet 6  .  lisinopril-hydrochlorothiazide (PRINZIDE,ZESTORETIC) 20-12.5 MG per tablet TAKE 1 TABLET BY MOUTH EVERY DAY 90 tablet 0  . metoprolol tartrate (LOPRESSOR) 25 MG tablet Take 1 tablet (25 mg total) by mouth 2 (two) times daily. 60 tablet 3  . Probiotic Product (PROBIOTIC DAILY PO) Take by mouth daily.     No current facility-administered medications on file prior to visit.    No Known Allergies  Past Medical History  Diagnosis Date  . Macular degeneration   . Hyperlipidemia   . Osteoporosis     osteoarthritis  . A-fib   . Hypertension   . Heart murmur   . Kidney problem     Past Surgical History  Procedure Laterality Date  . Appendectomy  1942  . Vaginal hysterectomy  1952  . Tonsillectomy  1928    ? Adenoids  . Inguinal hernia repair  1946    Left  . Cataract extraction  1999    Family History  Problem Relation Age of Onset  . Cancer Mother     colon  . Heart disease Father     History   Social History  . Marital Status: Widowed    Spouse Name: N/A  . Number of Children: 1  . Years of Education: N/A   Occupational History  . Housewife    Social History Main Topics  . Smoking status: Never Smoker   . Smokeless  tobacco: Never Used  . Alcohol Use: 1.2 oz/week    2 Glasses of wine per week     Comment: WINE 5 DAYS PER WEEK  . Drug Use: No  . Sexual Activity: Not on file   Other Topics Concern  . Not on file   Social History Narrative   Has a living will   Son HPOA   DNR   The PMH, PSH, Social History, Family History, Medications, and allergies have been reviewed in [facility], and have been updated if relevant.  Review of Systems  Constitutional: Negative.   HENT: Negative.   Respiratory: Negative.   Cardiovascular: Negative.   Gastrointestinal: Negative.   Endocrine: Negative.   Genitourinary: Negative.   Musculoskeletal: Negative.   Skin: Negative.   Allergic/Immunologic: Negative.   Neurological: Negative.   Hematological: Negative.     Psychiatric/Behavioral: Negative.   All other systems reviewed and are negative.      Objective:    BP 104/70 mmHg  Pulse 69  Temp(Src) 97.7 F (36.5 C) (Oral)  Wt 114 lb 8 oz (51.937 kg)  SpO2 98%   Physical Exam  Constitutional: She is oriented to person, place, and time. She appears well-developed and well-nourished. No distress.  HENT:  Head: Normocephalic.  Eyes: Conjunctivae are normal.  Neck: Normal range of motion.  Cardiovascular: Normal rate.  An irregularly irregular rhythm present.  Pulmonary/Chest: Effort normal and breath sounds normal.  Musculoskeletal: Normal range of motion. She exhibits no edema.  Neurological: She is alert and oriented to person, place, and time. No cranial nerve deficit.  Skin: Skin is warm and dry.  Psychiatric: She has a normal mood and affect. Her behavior is normal. Judgment and thought content normal.  Nursing note and vitals reviewed.   mini-mental status exam        Assessment & Plan:   Memory loss No Follow-up on file.

## 2015-03-22 NOTE — Assessment & Plan Note (Signed)
Rate controlled- on metoprolol, eliquis. No changes in rx today

## 2015-03-22 NOTE — Assessment & Plan Note (Signed)
>  25 minutes spent in face to face time with patient, >50% spent in counselling or coordination of care New- likely multifactorial MMSE normal today. Will check labs to rule out other possible contributing factors. The patient and her family indicate understanding of these issues and agrees with the plan.

## 2015-03-24 ENCOUNTER — Encounter: Payer: Self-pay | Admitting: Family Medicine

## 2015-04-01 ENCOUNTER — Ambulatory Visit (INDEPENDENT_AMBULATORY_CARE_PROVIDER_SITE_OTHER): Payer: Medicare Other | Admitting: Cardiovascular Disease

## 2015-04-01 ENCOUNTER — Encounter: Payer: Self-pay | Admitting: Cardiovascular Disease

## 2015-04-01 VITALS — BP 118/62 | HR 72 | Ht 65.5 in | Wt 118.0 lb

## 2015-04-01 DIAGNOSIS — I10 Essential (primary) hypertension: Secondary | ICD-10-CM

## 2015-04-01 DIAGNOSIS — I482 Chronic atrial fibrillation, unspecified: Secondary | ICD-10-CM | POA: Insufficient documentation

## 2015-04-01 DIAGNOSIS — I4891 Unspecified atrial fibrillation: Secondary | ICD-10-CM | POA: Diagnosis not present

## 2015-04-01 NOTE — Progress Notes (Signed)
Primary care physician: Dr. Deborra Medina  HPI  This is a 81 year old female who is here today for a follow-up visit regarding atrial fibrillation. She has no previous cardiac history. She was seen recently for atrial fibrillation with rapid ventricular response. She was sent to the emergency room at  [facility] Basic labs were unremarkable.  Creatinine was 1.54 with a BUN of 35. Cardiac enzymes were negative. BNP was 402. She was switched from amlodipine to diltiazem extended release. After extensive discussion, we decided to start anticoagulation with low dose Eliquis. She has been tolerating this. Echocardiogram showed normal LV systolic function, moderate mitral regurgitation, severely dilated left atrium and mildly dilated right atrium. She continues to complain of increased exertional dyspnea. During last visit, she was noted to be tachycardic. I switched her from diltiazem to metoprolol. She reports significant improvement in exertional palpitations since then. Recent labs were unremarkable.   No Known Allergies   Current Outpatient Prescriptions on File Prior to Visit  Medication Sig Dispense Refill  . apixaban (ELIQUIS) 2.5 MG TABS tablet Take 1 tablet (2.5 mg total) by mouth 2 (two) times daily. 60 tablet 6  . lisinopril-hydrochlorothiazide (PRINZIDE,ZESTORETIC) 20-12.5 MG per tablet TAKE 1 TABLET BY MOUTH EVERY DAY 90 tablet 0  . metoprolol tartrate (LOPRESSOR) 25 MG tablet Take 1 tablet (25 mg total) by mouth 2 (two) times daily. 60 tablet 3  . Probiotic Product (PROBIOTIC DAILY PO) Take by mouth daily.     No current facility-administered medications on file prior to visit.     Past Medical History  Diagnosis Date  . Macular degeneration   . Hyperlipidemia   . Osteoporosis     osteoarthritis  . A-fib   . Hypertension   . Heart murmur   . Kidney problem      Past Surgical History  Procedure Laterality Date  . Appendectomy  1942  . Vaginal hysterectomy  1952  . Tonsillectomy   1928    ? Adenoids  . Inguinal hernia repair  1946    Left  . Cataract extraction  1999     Family History  Problem Relation Age of Onset  . Cancer Mother     colon  . Heart disease Father      History   Social History  . Marital Status: Widowed    Spouse Name: N/A  . Number of Children: 1  . Years of Education: N/A   Occupational History  . Housewife    Social History Main Topics  . Smoking status: Never Smoker   . Smokeless tobacco: Never Used  . Alcohol Use: 1.2 oz/week    2 Glasses of wine per week     Comment: WINE 5 DAYS PER WEEK  . Drug Use: No  . Sexual Activity: Not on file   Other Topics Concern  . Not on file   Social History Narrative   Has a living will   Son HPOA   DNR     ROS A 10 point review of system was performed. It is negative other than that mentioned in the history of present illness.   PHYSICAL EXAM   BP 118/62 mmHg  Pulse 72  Ht 5' 5.5" (1.664 m)  Wt 118 lb (53.524 kg)  BMI 19.33 kg/m2 Constitutional: She is oriented to person, place, and time. She appears well-developed and well-nourished. No distress.  HENT: No nasal discharge.  Head: Normocephalic and atraumatic.  Eyes: Pupils are equal and round. No discharge.  Neck: Normal range of motion.  Neck supple. No JVD present. No thyromegaly present.  Cardiovascular: Normal rate, irregular rhythm, normal heart sounds. Exam reveals no gallop and no friction rub. No murmur heard.  Pulmonary/Chest: Effort normal and breath sounds normal. No stridor. No respiratory distress. She has no wheezes. She has no rales. She exhibits no tenderness.  Abdominal: Soft. Bowel sounds are normal. She exhibits no distension. There is no tenderness. There is no rebound and no guarding.  Musculoskeletal: Normal range of motion. She exhibits no edema and no tenderness.  Neurological: She is alert and oriented to person, place, and time. Coordination normal.  Skin: Skin is warm and dry. No rash noted.  She is not diaphoretic. No erythema. No pallor.  Psychiatric: She has a normal mood and affect. Her behavior is normal. Judgment and thought content normal.     EKG: Atrial fibrillation  Diffuse low voltage.  Nonspecific IVCD  ABNORMAL     ASSESSMENT AND PLAN

## 2015-04-01 NOTE — Assessment & Plan Note (Signed)
Blood pressure is well controlled on current medications. 

## 2015-04-01 NOTE — Assessment & Plan Note (Signed)
Ventricular rate is much better controlled since she was switched to metoprolol. She is feeling better. She is tolerating anticoagulation with no evidence of bleeding. Continue same medications.

## 2015-04-01 NOTE — Patient Instructions (Signed)
Medication Instructions: Continue same medications.   Labwork: None.   Procedures/Testing: None.   Follow-Up: 6 months with Dr. Teneshia Hedeen.   Any Additional Special Instructions Will Be Listed Below (If Applicable).   

## 2015-04-06 ENCOUNTER — Telehealth: Payer: Self-pay | Admitting: Family Medicine

## 2015-04-06 DIAGNOSIS — R4189 Other symptoms and signs involving cognitive functions and awareness: Secondary | ICD-10-CM

## 2015-04-06 NOTE — Telephone Encounter (Signed)
Son, Clair Gulling- HPOA, and Coffey, ALF RN at [facility] are concerned about acute onset of her change in cognitive decline.  No infection has been found and they are requesting referral to neurology.  Referral placed.

## 2015-04-14 ENCOUNTER — Ambulatory Visit: Payer: Medicare Other

## 2015-04-19 DIAGNOSIS — H3532 Exudative age-related macular degeneration: Secondary | ICD-10-CM | POA: Diagnosis not present

## 2015-04-26 ENCOUNTER — Ambulatory Visit: Payer: Medicare Other

## 2015-05-04 ENCOUNTER — Ambulatory Visit: Payer: Medicare Other

## 2015-05-24 ENCOUNTER — Ambulatory Visit: Payer: Federal, State, Local not specified - PPO

## 2015-06-15 DIAGNOSIS — G3184 Mild cognitive impairment, so stated: Secondary | ICD-10-CM | POA: Diagnosis not present

## 2015-06-15 DIAGNOSIS — E538 Deficiency of other specified B group vitamins: Secondary | ICD-10-CM | POA: Diagnosis not present

## 2015-06-15 DIAGNOSIS — R413 Other amnesia: Secondary | ICD-10-CM | POA: Diagnosis not present

## 2015-06-16 DIAGNOSIS — G3184 Mild cognitive impairment, so stated: Secondary | ICD-10-CM | POA: Diagnosis not present

## 2015-06-16 DIAGNOSIS — R413 Other amnesia: Secondary | ICD-10-CM | POA: Diagnosis not present

## 2015-06-16 DIAGNOSIS — E538 Deficiency of other specified B group vitamins: Secondary | ICD-10-CM | POA: Diagnosis not present

## 2015-06-17 DIAGNOSIS — N183 Chronic kidney disease, stage 3 (moderate): Secondary | ICD-10-CM | POA: Diagnosis not present

## 2015-06-17 DIAGNOSIS — I10 Essential (primary) hypertension: Secondary | ICD-10-CM | POA: Diagnosis not present

## 2015-06-17 DIAGNOSIS — R809 Proteinuria, unspecified: Secondary | ICD-10-CM | POA: Diagnosis not present

## 2015-06-17 DIAGNOSIS — N2581 Secondary hyperparathyroidism of renal origin: Secondary | ICD-10-CM | POA: Diagnosis not present

## 2015-06-23 DIAGNOSIS — H3532 Exudative age-related macular degeneration: Secondary | ICD-10-CM | POA: Diagnosis not present

## 2015-07-02 ENCOUNTER — Other Ambulatory Visit: Payer: Self-pay | Admitting: Family Medicine

## 2015-08-09 DIAGNOSIS — Z23 Encounter for immunization: Secondary | ICD-10-CM | POA: Diagnosis not present

## 2015-08-25 DIAGNOSIS — H353222 Exudative age-related macular degeneration, left eye, with inactive choroidal neovascularization: Secondary | ICD-10-CM | POA: Diagnosis not present

## 2015-09-20 ENCOUNTER — Ambulatory Visit: Payer: Federal, State, Local not specified - PPO | Admitting: Cardiovascular Disease

## 2015-09-27 DIAGNOSIS — R809 Proteinuria, unspecified: Secondary | ICD-10-CM | POA: Diagnosis not present

## 2015-09-27 DIAGNOSIS — I10 Essential (primary) hypertension: Secondary | ICD-10-CM | POA: Diagnosis not present

## 2015-09-27 DIAGNOSIS — N2581 Secondary hyperparathyroidism of renal origin: Secondary | ICD-10-CM | POA: Diagnosis not present

## 2015-09-27 DIAGNOSIS — N184 Chronic kidney disease, stage 4 (severe): Secondary | ICD-10-CM | POA: Diagnosis not present

## 2015-10-01 ENCOUNTER — Ambulatory Visit (INDEPENDENT_AMBULATORY_CARE_PROVIDER_SITE_OTHER): Payer: Medicare Other | Admitting: Cardiovascular Disease

## 2015-10-01 ENCOUNTER — Encounter: Payer: Self-pay | Admitting: Cardiovascular Disease

## 2015-10-01 VITALS — BP 122/68 | HR 76 | Ht 65.0 in | Wt 123.8 lb

## 2015-10-01 DIAGNOSIS — I4891 Unspecified atrial fibrillation: Secondary | ICD-10-CM | POA: Diagnosis not present

## 2015-10-01 DIAGNOSIS — I482 Chronic atrial fibrillation, unspecified: Secondary | ICD-10-CM

## 2015-10-01 DIAGNOSIS — I10 Essential (primary) hypertension: Secondary | ICD-10-CM | POA: Diagnosis not present

## 2015-10-01 NOTE — Patient Instructions (Signed)
Medication Instructions: Continue same medications.   Labwork: None.   Procedures/Testing: None.   Follow-Up: 6 months with Dr. Aryelle Figg.   Any Additional Special Instructions Will Be Listed Below (If Applicable).   

## 2015-10-01 NOTE — Assessment & Plan Note (Signed)
She is doing very well with no symptoms related to this. Ventricular rate is well controlled. She is tolerating anticoagulation without bleeding side effects. I made no changes today. Follow-up in 6 months.

## 2015-10-01 NOTE — Progress Notes (Signed)
Primary care physician: Dr. Deborra Medina  HPI  This is a 81 year old female who is here today for a follow-up visit regarding chronic atrial fibrillation.  Echocardiogram in April 2016 showed normal LV systolic function, moderate mitral regurgitation, severely dilated left atrium and mildly dilated right atrium.  She has been treated with rate control with metoprolol and anticoagulation with low dose Eliquis given stage IV chronic kidney disease. She lives in [facility] assisted living facility. She has been doing very well and denies any chest pain, shortness of breath or palpitations.  No Known Allergies   Current Outpatient Prescriptions on File Prior to Visit  Medication Sig Dispense Refill  . apixaban (ELIQUIS) 2.5 MG TABS tablet Take 1 tablet (2.5 mg total) by mouth 2 (two) times daily. 60 tablet 6  . lisinopril-hydrochlorothiazide (PRINZIDE,ZESTORETIC) 20-12.5 MG per tablet TAKE 1 TABLET BY MOUTH EVERY DAY 90 tablet 1  . metoprolol tartrate (LOPRESSOR) 25 MG tablet Take 1 tablet (25 mg total) by mouth 2 (two) times daily. 60 tablet 3  . Probiotic Product (PROBIOTIC DAILY PO) Take by mouth daily.     No current facility-administered medications on file prior to visit.     Past Medical History  Diagnosis Date  . Macular degeneration   . Hyperlipidemia   . Osteoporosis     osteoarthritis  . A-fib ([place])   . Hypertension   . Heart murmur   . Kidney problem      Past Surgical History  Procedure Laterality Date  . Appendectomy  1942  . Vaginal hysterectomy  1952  . Tonsillectomy  1928    ? Adenoids  . Inguinal hernia repair  1946    Left  . Cataract extraction  1999     Family History  Problem Relation Age of Onset  . Cancer Mother     colon  . Heart disease Father      Social History   Social History  . Marital Status: Widowed    Spouse Name: N/A  . Number of Children: 1  . Years of Education: N/A   Occupational History  . Housewife    Social History Main  Topics  . Smoking status: Never Smoker   . Smokeless tobacco: Never Used  . Alcohol Use: 1.2 oz/week    2 Glasses of wine per week     Comment: WINE 5 DAYS PER WEEK  . Drug Use: No  . Sexual Activity: Not on file   Other Topics Concern  . Not on file   Social History Narrative   Has a living will   Son HPOA   DNR     ROS A 10 point review of system was performed. It is negative other than that mentioned in the history of present illness.   PHYSICAL EXAM   BP 122/68 mmHg  Pulse 76  Ht 5\' 5"  (1.651 m)  Wt 123 lb 12 oz (56.133 kg)  BMI 20.59 kg/m2 Constitutional: She is oriented to person, place, and time. She appears well-developed and well-nourished. No distress.  HENT: No nasal discharge.  Head: Normocephalic and atraumatic.  Eyes: Pupils are equal and round. No discharge.  Neck: Normal range of motion. Neck supple. No JVD present. No thyromegaly present.  Cardiovascular: Normal rate, irregular rhythm, normal heart sounds. Exam reveals no gallop and no friction rub. No murmur heard.  Pulmonary/Chest: Effort normal and breath sounds normal. No stridor. No respiratory distress. She has no wheezes. She has no rales. She exhibits no tenderness.  Abdominal:  Soft. Bowel sounds are normal. She exhibits no distension. There is no tenderness. There is no rebound and no guarding.  Musculoskeletal: Normal range of motion. She exhibits no edema and no tenderness.  Neurological: She is alert and oriented to person, place, and time. Coordination normal.  Skin: Skin is warm and dry. No rash noted. She is not diaphoretic. No erythema. No pallor.  Psychiatric: She has a normal mood and affect. Her behavior is normal. Judgment and thought content normal.     EKG: Atrial fibrillation with possible old anteroseptal infarct.   ABNORMAL     ASSESSMENT AND PLAN

## 2015-10-01 NOTE — Assessment & Plan Note (Signed)
Blood pressure is well controlled on current medications. 

## 2015-11-05 DIAGNOSIS — H353112 Nonexudative age-related macular degeneration, right eye, intermediate dry stage: Secondary | ICD-10-CM | POA: Diagnosis not present

## 2015-11-05 DIAGNOSIS — H353222 Exudative age-related macular degeneration, left eye, with inactive choroidal neovascularization: Secondary | ICD-10-CM | POA: Diagnosis not present

## 2015-12-16 ENCOUNTER — Other Ambulatory Visit: Payer: Self-pay | Admitting: Family Medicine

## 2015-12-16 DIAGNOSIS — E785 Hyperlipidemia, unspecified: Secondary | ICD-10-CM

## 2015-12-23 ENCOUNTER — Other Ambulatory Visit (INDEPENDENT_AMBULATORY_CARE_PROVIDER_SITE_OTHER): Payer: Medicare Other

## 2015-12-23 DIAGNOSIS — R809 Proteinuria, unspecified: Secondary | ICD-10-CM | POA: Diagnosis not present

## 2015-12-23 DIAGNOSIS — N2581 Secondary hyperparathyroidism of renal origin: Secondary | ICD-10-CM | POA: Diagnosis not present

## 2015-12-23 DIAGNOSIS — I10 Essential (primary) hypertension: Secondary | ICD-10-CM | POA: Diagnosis not present

## 2015-12-23 DIAGNOSIS — N184 Chronic kidney disease, stage 4 (severe): Secondary | ICD-10-CM | POA: Diagnosis not present

## 2015-12-23 DIAGNOSIS — E785 Hyperlipidemia, unspecified: Secondary | ICD-10-CM

## 2015-12-23 LAB — COMPREHENSIVE METABOLIC PANEL
ALBUMIN: 3.9 g/dL (ref 3.5–5.2)
ALK PHOS: 72 U/L (ref 39–117)
ALT: 17 U/L (ref 0–35)
AST: 23 U/L (ref 0–37)
BUN: 33 mg/dL — AB (ref 6–23)
CHLORIDE: 100 meq/L (ref 96–112)
CO2: 30 mEq/L (ref 19–32)
CREATININE: 1.66 mg/dL — AB (ref 0.40–1.20)
Calcium: 9.2 mg/dL (ref 8.4–10.5)
GFR: 30.55 mL/min — ABNORMAL LOW (ref >60.00)
GLUCOSE: 99 mg/dL (ref 70–99)
POTASSIUM: 4.5 meq/L (ref 3.5–5.1)
SODIUM: 139 meq/L (ref 135–145)
TOTAL PROTEIN: 6.9 g/dL (ref 6.0–8.3)
Total Bilirubin: 0.5 mg/dL (ref 0.2–1.2)

## 2015-12-23 LAB — LIPID PANEL
CHOLESTEROL: 267 mg/dL — AB (ref 0–200)
HDL: 71.5 mg/dL (ref >39.00)
LDL CALC: 176 mg/dL — AB (ref 0–99)
NONHDL: 195.24
Total CHOL/HDL Ratio: 4
Triglycerides: 94 mg/dL (ref 0.0–149.0)
VLDL: 18.8 mg/dL (ref 0.0–40.0)

## 2015-12-23 LAB — TSH: TSH: 1.86 u[IU]/mL (ref 0.35–4.50)

## 2015-12-30 ENCOUNTER — Encounter: Payer: Self-pay | Admitting: Family Medicine

## 2015-12-30 ENCOUNTER — Ambulatory Visit (INDEPENDENT_AMBULATORY_CARE_PROVIDER_SITE_OTHER): Payer: Medicare Other | Admitting: Family Medicine

## 2015-12-30 VITALS — BP 136/76 | HR 80 | Temp 97.4°F | Ht 61.25 in | Wt 129.0 lb

## 2015-12-30 DIAGNOSIS — N183 Chronic kidney disease, stage 3 unspecified: Secondary | ICD-10-CM

## 2015-12-30 DIAGNOSIS — I482 Chronic atrial fibrillation, unspecified: Secondary | ICD-10-CM

## 2015-12-30 DIAGNOSIS — Z0000 Encounter for general adult medical examination without abnormal findings: Secondary | ICD-10-CM | POA: Diagnosis not present

## 2015-12-30 DIAGNOSIS — I10 Essential (primary) hypertension: Secondary | ICD-10-CM | POA: Diagnosis not present

## 2015-12-30 DIAGNOSIS — Z66 Do not resuscitate: Secondary | ICD-10-CM

## 2015-12-30 DIAGNOSIS — M159 Polyosteoarthritis, unspecified: Secondary | ICD-10-CM

## 2015-12-30 DIAGNOSIS — E785 Hyperlipidemia, unspecified: Secondary | ICD-10-CM | POA: Diagnosis not present

## 2015-12-30 NOTE — Assessment & Plan Note (Signed)
Rate controlled. Continue betablocker and eliquis.

## 2015-12-30 NOTE — Progress Notes (Signed)
Pre visit review using our clinic review tool, if applicable. No additional management support is needed unless otherwise documented below in the visit note. 

## 2015-12-30 NOTE — Assessment & Plan Note (Signed)
Well controlled. No changes made to rx today.

## 2015-12-30 NOTE — Progress Notes (Signed)
Subjective:   Patient ID: Lindsey Cline, female    DOB: 1919/02/22, 82 y.o.   MRN: [id number]  Lindsey Cline is a pleasant 82 y.o. year old female who presents with her son to clinic today with her son for Annual Exam  and follow up of chronic medical conditions on 12/30/2015  HPI:   I have personally reviewed the Medicare Annual Wellness questionnaire and have noted 1. The patient's medical and social history 2. Their use of alcohol, tobacco or illicit drugs 3. Their current medications and supplements 4. The patient's functional ability including ADL's, fall risks, home safety risks and hearing or visual             impairment. 5. Diet and physical activities 6. Evidence for depression or mood disorders  End of life wishes discussed and updated in Social History.  The roster of all physicians providing medical care to patient - is listed in the Snapshot section of the chart.  Now in ALF and doing well.  Eating 3 full meals a day! Has gained weight which is great but cholesterol has increased.  Lab Results  Component Value Date   CHOL 267* 12/23/2015   HDL 71.50 12/23/2015   LDLCALC 176* 12/23/2015   LDLDIRECT 128.8 05/29/2008   TRIG 94.0 12/23/2015   CHOLHDL 4 12/23/2015     Zoster 01/18/15 Eye exam 11/20/13 Tdap 05/10/07 Flu vaccine 07/16/15 Pneumovax 05/20/07 Prevnar 13 12/28/14  Remote h/o hysterectomy  Afib- followed by Dr. Fletcher Anon.  Last saw him on 10/01/15.  Note reviewed. Advised continued eliquis and beta blocker.  HTN-  Has been on Lisinopril-HCTZ 20-12.5 mg daily for years and now on lopressor as well due to afib.    Denies any HA, blurred vision, CP or SOB. Remains very active- takes the stairs everyday and walks down the hallway.  Lab Results  Component Value Date   CREATININE 1.66* 12/23/2015   Lab Results  Component Value Date   CHOL 267* 12/23/2015   HDL 71.50 12/23/2015   LDLCALC 176* 12/23/2015   LDLDIRECT 128.8 05/29/2008   TRIG 94.0  12/23/2015   CHOLHDL 4 12/23/2015   Lab Results  Component Value Date   WBC 6.0 03/22/2015   HGB 12.2 03/22/2015   HCT 37.3 03/22/2015   MCV 99.6 03/22/2015   PLT 254.0 03/22/2015   Lab Results  Component Value Date   ALT 17 12/23/2015   AST 23 12/23/2015   ALKPHOS 72 12/23/2015   BILITOT 0.5 12/23/2015     BP Readings from Last 3 Encounters:  12/30/15 136/76  10/01/15 122/68  04/01/15 118/62    HLD- Lab Results  Component Value Date   CHOL 267* 12/23/2015   HDL 71.50 12/23/2015   LDLCALC 176* 12/23/2015   LDLDIRECT 128.8 05/29/2008   TRIG 94.0 12/23/2015   CHOLHDL 4 12/23/2015    CKD III- followed by Renal, Dr. Holley Raring.  Cr stable.  Was last 12/23/15.  Appetite good- weight improved!  Wt Readings from Last 3 Encounters:  12/30/15 129 lb (58.514 kg)  10/01/15 123 lb 12 oz (56.133 kg)  04/01/15 118 lb (53.524 kg)    Patient Active Problem List   Diagnosis Date Noted  . Cognitive decline 04/06/2015  . Chronic atrial fibrillation ([place]) 04/01/2015  . Memory loss 03/22/2015  . Mitral regurgitation 01/29/2015  . Medicare annual wellness visit, subsequent 12/28/2014  . DNR (do not resuscitate) 12/28/2014  . Atrial fibrillation with RVR ([place]) 12/28/2014  . CKD (chronic kidney disease),  stage III 12/28/2014  . Hearing loss 12/25/2013  . ESSENTIAL HYPERTENSION, BENIGN 09/14/2010  . HLD (hyperlipidemia) 05/29/2008  . MACULAR DEGENERATION 05/29/2008  . Osteoarthritis 05/29/2008   Past Medical History  Diagnosis Date  . Macular degeneration   . Hyperlipidemia   . Osteoporosis     osteoarthritis  . A-fib ([place])   . Hypertension   . Heart murmur   . Kidney problem    Past Surgical History  Procedure Laterality Date  . Appendectomy  1942  . Vaginal hysterectomy  1952  . Tonsillectomy  1928    ? Adenoids  . Inguinal hernia repair  1946    Left  . Cataract extraction  1999   Social History  Substance Use Topics  . Smoking status: Never Smoker   .  Smokeless tobacco: Never Used  . Alcohol Use: 1.2 oz/week    2 Glasses of wine per week     Comment: WINE 5 DAYS PER WEEK   Family History  Problem Relation Age of Onset  . Cancer Mother     colon  . Heart disease Father    No Known Allergies Current Outpatient Prescriptions on File Prior to Visit  Medication Sig Dispense Refill  . apixaban (ELIQUIS) 2.5 MG TABS tablet Take 1 tablet (2.5 mg total) by mouth 2 (two) times daily. 60 tablet 6  . Cyanocobalamin (VITAMIN B-12) 1000 MCG SUBL Place under the tongue every 30 (thirty) days.    Marland Kitchen lisinopril-hydrochlorothiazide (PRINZIDE,ZESTORETIC) 20-12.5 MG per tablet TAKE 1 TABLET BY MOUTH EVERY DAY 90 tablet 1  . metoprolol tartrate (LOPRESSOR) 25 MG tablet Take 1 tablet (25 mg total) by mouth 2 (two) times daily. 60 tablet 3  . Probiotic Product (PROBIOTIC DAILY PO) Take by mouth daily.    . Vitamin D, Ergocalciferol, (DRISDOL) 50000 UNITS CAPS capsule Take 50,000 Units by mouth every 7 (seven) days.     No current facility-administered medications on file prior to visit.   The PMH, PSH, Social History, Family History, Medications, and allergies have been reviewed in [facility], and have been updated if relevant.  Review of Systems  Constitutional: Negative for appetite change.  HENT: Positive for postnasal drip.   Eyes: Negative for pain.  Respiratory: Positive for shortness of breath. Negative for chest tightness.   Cardiovascular: Negative.   Gastrointestinal: Negative.   Endocrine: Negative.   Genitourinary: Negative.   Musculoskeletal: Negative.   Skin: Negative.   Psychiatric/Behavioral: Negative.   All other systems reviewed and are negative.      Objective:    BP 136/76 mmHg  Pulse 80  Temp(Src) 97.4 F (36.3 C) (Oral)  Ht 5' 1.25" (1.556 m)  Wt 129 lb (58.514 kg)  BMI 24.17 kg/m2  SpO2 97%   Physical Exam   General: alert and normal appearance.  HEENT:  TMs clear bilaterally, no cerumen Mouth: no erythema and no  lesions.  Lungs: normal respiratory effort and normal breath sounds.  Heart:normal rate, irregularly irregular III/VI SEM  Psych: normally interactive, good eye contact, not anxious appearing, and not depressed appearing.  Neuro- normal gait Ext:  No edema       Assessment & Plan:   Chronic atrial fibrillation (HCC)  CKD (chronic kidney disease), stage III  DNR (do not resuscitate)  Essential hypertension, benign  HLD (hyperlipidemia)  Medicare annual wellness visit, subsequent  Osteoarthritis of multiple joints, unspecified osteoarthritis type No Follow-up on file.

## 2015-12-30 NOTE — Assessment & Plan Note (Signed)
Deteriorated. Wrote order for son to take back to ALF asking for lower cholesterol diet and low cholesterol handout attached. Following up with cardiology in a few months and cholesterol can be repeated at that time.

## 2015-12-30 NOTE — Assessment & Plan Note (Signed)
Cr stable. Followed by renal.

## 2015-12-30 NOTE — Assessment & Plan Note (Signed)
The patients weight, height, BMI and visual acuity have been recorded in the chart.  Cognitive function assessed.   I have made referrals, counseling and provided education to the patient based review of the above and I have provided the pt with a written personalized care plan for preventive services.  

## 2016-01-03 DIAGNOSIS — G3184 Mild cognitive impairment, so stated: Secondary | ICD-10-CM | POA: Diagnosis not present

## 2016-01-03 DIAGNOSIS — E538 Deficiency of other specified B group vitamins: Secondary | ICD-10-CM | POA: Diagnosis not present

## 2016-01-05 ENCOUNTER — Encounter: Payer: Self-pay | Admitting: Family Medicine

## 2016-01-19 DIAGNOSIS — H353222 Exudative age-related macular degeneration, left eye, with inactive choroidal neovascularization: Secondary | ICD-10-CM | POA: Diagnosis not present

## 2016-01-22 ENCOUNTER — Emergency Department: Payer: Medicare Other

## 2016-01-22 ENCOUNTER — Encounter: Payer: Self-pay | Admitting: Emergency Medicine

## 2016-01-22 ENCOUNTER — Emergency Department
Admission: EM | Admit: 2016-01-22 | Discharge: 2016-01-22 | Disposition: A | Discharge status: Home / Self Care | Payer: Medicare Other | Attending: Emergency Medicine | Admitting: Emergency Medicine

## 2016-01-22 DIAGNOSIS — I4891 Unspecified atrial fibrillation: Secondary | ICD-10-CM | POA: Diagnosis not present

## 2016-01-22 DIAGNOSIS — W19XXXA Unspecified fall, initial encounter: Secondary | ICD-10-CM | POA: Diagnosis not present

## 2016-01-22 DIAGNOSIS — M810 Age-related osteoporosis without current pathological fracture: Secondary | ICD-10-CM | POA: Diagnosis not present

## 2016-01-22 DIAGNOSIS — Y999 Unspecified external cause status: Secondary | ICD-10-CM | POA: Diagnosis not present

## 2016-01-22 DIAGNOSIS — S0990XA Unspecified injury of head, initial encounter: Secondary | ICD-10-CM | POA: Diagnosis not present

## 2016-01-22 DIAGNOSIS — Y939 Activity, unspecified: Secondary | ICD-10-CM | POA: Insufficient documentation

## 2016-01-22 DIAGNOSIS — W1839XA Other fall on same level, initial encounter: Secondary | ICD-10-CM | POA: Diagnosis not present

## 2016-01-22 DIAGNOSIS — E785 Hyperlipidemia, unspecified: Secondary | ICD-10-CM | POA: Diagnosis not present

## 2016-01-22 DIAGNOSIS — S8002XA Contusion of left knee, initial encounter: Secondary | ICD-10-CM | POA: Diagnosis not present

## 2016-01-22 DIAGNOSIS — Y929 Unspecified place or not applicable: Secondary | ICD-10-CM | POA: Insufficient documentation

## 2016-01-22 DIAGNOSIS — I10 Essential (primary) hypertension: Secondary | ICD-10-CM | POA: Diagnosis not present

## 2016-01-22 NOTE — ED Notes (Signed)
Patient from [facility] via EMS. Patient was carrying a laundry basket when she tripped and fell striking her head and knee. Left sided injury to head and left knee injury. Denies LOC

## 2016-01-22 NOTE — ED Provider Notes (Signed)
[facility] Emergency Department Provider Note  ____________________________________________   I have reviewed the triage vital signs and the nursing notes.   HISTORY  Chief Complaint No chief complaint on file.    HPI Lindsey Cline is a 82 y.o. female  who presents today after a non-syncopal fall. She was getting her laundry and she tripped. She remembers falling and hitting her head. She is on Eliquis. She denies any numbness or weakness or neck pain. She does have a bruise to her left knee. She does not have any hip pain.There is no antecedent nurses when chest pain shortness of breath nausea vomiting or lightheadedness. She did not pass out      Past Medical History  Diagnosis Date  . Macular degeneration   . Hyperlipidemia   . Osteoporosis     osteoarthritis  . A-fib ([place])   . Hypertension   . Heart murmur   . Kidney problem     Patient Active Problem List   Diagnosis Date Noted  . Cognitive decline 04/06/2015  . Chronic atrial fibrillation ([place]) 04/01/2015  . Memory loss 03/22/2015  . Mitral regurgitation 01/29/2015  . Medicare annual wellness visit, subsequent 12/28/2014  . DNR (do not resuscitate) 12/28/2014  . Atrial fibrillation with RVR ([place]) 12/28/2014  . CKD (chronic kidney disease), stage III 12/28/2014  . Hearing loss 12/25/2013  . ESSENTIAL HYPERTENSION, BENIGN 09/14/2010  . HLD (hyperlipidemia) 05/29/2008  . MACULAR DEGENERATION 05/29/2008  . Osteoarthritis 05/29/2008    Past Surgical History  Procedure Laterality Date  . Appendectomy  1942  . Vaginal hysterectomy  1952  . Tonsillectomy  1928    ? Adenoids  . Inguinal hernia repair  1946    Left  . Cataract extraction  1999    Current Outpatient Rx  Name  Route  Sig  Dispense  Refill  . apixaban (ELIQUIS) 2.5 MG TABS tablet   Oral   Take 1 tablet (2.5 mg total) by mouth 2 (two) times daily.   60 tablet   6   . Cyanocobalamin (VITAMIN B-12) 1000 MCG SUBL    Sublingual   Place under the tongue every 30 (thirty) days.         Marland Kitchen lisinopril-hydrochlorothiazide (PRINZIDE,ZESTORETIC) 20-12.5 MG per tablet      TAKE 1 TABLET BY MOUTH EVERY DAY   90 tablet   1   . metoprolol tartrate (LOPRESSOR) 25 MG tablet   Oral   Take 1 tablet (25 mg total) by mouth 2 (two) times daily.   60 tablet   3   . Probiotic Product (PROBIOTIC DAILY PO)   Oral   Take by mouth daily.         . Vitamin D, Ergocalciferol, (DRISDOL) 50000 UNITS CAPS capsule   Oral   Take 50,000 Units by mouth every 7 (seven) days.           Allergies Review of patient's allergies indicates no known allergies.  Family History  Problem Relation Age of Onset  . Cancer Mother     colon  . Heart disease Father     Social History Social History  Substance Use Topics  . Smoking status: Never Smoker   . Smokeless tobacco: Never Used  . Alcohol Use: 1.2 oz/week    2 Glasses of wine per week     Comment: WINE 5 DAYS PER WEEK    Review of Systems Constitutional: No fever/chills Eyes: No visual changes. ENT: No sore throat. No stiff  neck no neck pain Cardiovascular: Denies chest pain. Respiratory: Denies shortness of breath. Gastrointestinal:   no vomiting.  No diarrhea.  No constipation. Genitourinary: Negative for dysuria. Musculoskeletal: Negative lower extremity swelling Skin: Negative for rash. Neurological: Negative for headaches, focal weakness or numbness. 10-point ROS otherwise negative.  ____________________________________________   PHYSICAL EXAM:  VITAL SIGNS: ED Triage Vitals  Enc Vitals Group     BP 01/22/16 0733 151/68 mmHg     Pulse Rate 01/22/16 0733 85     Resp 01/22/16 0733 18     Temp 01/22/16 0733 98.1 F (36.7 C)     Temp Source 01/22/16 0733 Oral     SpO2 01/22/16 0733 96 %     Weight 01/22/16 0733 130 lb (58.968 kg)     Height 01/22/16 0733 5\' 5"  (1.651 m)     Head Cir --      Peak Flow --      Pain Score --      Pain Loc --       Pain Edu? --      Excl. in [place]? --     Constitutional: Alert and oriented. Well appearing and in no acute distress. Eyes: Conjunctivae are normal. PERRL. EOMI. Head: Bruise to her upper left forehead noted, no skull fracture palpated. Nose: No congestion/rhinnorhea. Mouth/Throat: Mucous membranes are moist.  Oropharynx non-erythematous. Neck: No stridor.   Nontender with no meningismus Cardiovascular: Normal rate, regular rhythm. Grossly normal heart sounds.  Good peripheral circulation. Respiratory: Normal respiratory effort.  No retractions. Lungs CTAB. Abdominal: Soft and nontender. No distention. No guarding no rebound Back:  There is no focal tenderness or step off there is no midline tenderness there are no lesions noted. there is no CVA tenderness Musculoskeletal: There is bruising, mild, just to the proximal tib on the left near the knee with no obvious deformity. Can fully range both hips with no discomfort and patient. No pain to palpation of the hips. No other injury noted No joint effusions, no DVT signs strong distal pulses no edema Neurologic:  Normal speech and language. No gross focal neurologic deficits are appreciated.  Skin:  Skin is warm, dry and intact. No rash noted. Psychiatric: Mood and affect are normal. Speech and behavior are normal.  ____________________________________________   LABS (all labs ordered are listed, but only abnormal results are displayed)  Labs Reviewed - No data to display ____________________________________________  EKG  I personally interpreted any EKGs ordered by me or triage  ____________________________________________  RADIOLOGY  I reviewed any imaging ordered by me or triage that were performed during my shift and, if possible, patient and/or family made aware of any abnormal findings. ____________________________________________   PROCEDURES  Procedure(s) performed: None  Critical Care performed:  None  ____________________________________________   INITIAL IMPRESSION / ASSESSMENT AND PLAN / ED COURSE  Pertinent labs & imaging results that were available during my care of the patient were reviewed by me and considered in my medical decision making (see chart for details).  Patient with a non-syncopal fall this morning did hit her head is on blood thinners very well-appearing, we will obtain CT of the head as a precaution given her age and anticoagulation status, we'll obtain x-ray of her knee and reassess. As this was a nonsurgical fall do not think that there'll be any utility in significant blood work or cardiac workup. ____________________________________________   FINAL CLINICAL IMPRESSION(S) / ED DIAGNOSES  Final diagnoses:  None      This chart  was dictated using voice recognition software.  Despite best efforts to proofread,  errors can occur which can change meaning.     Schuyler Amor, MD 01/22/16 [phone]

## 2016-01-22 NOTE — ED Notes (Signed)
Report attempted x4. [id number] x [phone], multiple phone tree options selected without success.

## 2016-01-23 ENCOUNTER — Encounter: Payer: Self-pay | Admitting: Family Medicine

## 2016-01-26 DIAGNOSIS — M6281 Muscle weakness (generalized): Secondary | ICD-10-CM | POA: Diagnosis not present

## 2016-01-26 DIAGNOSIS — R2681 Unsteadiness on feet: Secondary | ICD-10-CM | POA: Diagnosis not present

## 2016-01-26 DIAGNOSIS — N183 Chronic kidney disease, stage 3 (moderate): Secondary | ICD-10-CM | POA: Diagnosis not present

## 2016-01-27 DIAGNOSIS — M6281 Muscle weakness (generalized): Secondary | ICD-10-CM | POA: Diagnosis not present

## 2016-01-27 DIAGNOSIS — E785 Hyperlipidemia, unspecified: Secondary | ICD-10-CM | POA: Diagnosis not present

## 2016-01-27 DIAGNOSIS — I482 Chronic atrial fibrillation: Secondary | ICD-10-CM | POA: Diagnosis not present

## 2016-01-27 DIAGNOSIS — W19XXXA Unspecified fall, initial encounter: Secondary | ICD-10-CM

## 2016-01-27 DIAGNOSIS — I10 Essential (primary) hypertension: Secondary | ICD-10-CM | POA: Diagnosis not present

## 2016-01-27 DIAGNOSIS — N183 Chronic kidney disease, stage 3 (moderate): Secondary | ICD-10-CM | POA: Diagnosis not present

## 2016-01-27 DIAGNOSIS — R2681 Unsteadiness on feet: Secondary | ICD-10-CM | POA: Diagnosis not present

## 2016-01-28 DIAGNOSIS — R2681 Unsteadiness on feet: Secondary | ICD-10-CM | POA: Diagnosis not present

## 2016-01-28 DIAGNOSIS — M6281 Muscle weakness (generalized): Secondary | ICD-10-CM | POA: Diagnosis not present

## 2016-01-28 DIAGNOSIS — N183 Chronic kidney disease, stage 3 (moderate): Secondary | ICD-10-CM | POA: Diagnosis not present

## 2016-01-31 DIAGNOSIS — N183 Chronic kidney disease, stage 3 (moderate): Secondary | ICD-10-CM | POA: Diagnosis not present

## 2016-01-31 DIAGNOSIS — M6281 Muscle weakness (generalized): Secondary | ICD-10-CM | POA: Diagnosis not present

## 2016-01-31 DIAGNOSIS — R2681 Unsteadiness on feet: Secondary | ICD-10-CM | POA: Diagnosis not present

## 2016-02-01 DIAGNOSIS — M6281 Muscle weakness (generalized): Secondary | ICD-10-CM | POA: Diagnosis not present

## 2016-02-01 DIAGNOSIS — N183 Chronic kidney disease, stage 3 (moderate): Secondary | ICD-10-CM | POA: Diagnosis not present

## 2016-02-01 DIAGNOSIS — R2681 Unsteadiness on feet: Secondary | ICD-10-CM | POA: Diagnosis not present

## 2016-02-02 DIAGNOSIS — N183 Chronic kidney disease, stage 3 (moderate): Secondary | ICD-10-CM | POA: Diagnosis not present

## 2016-02-02 DIAGNOSIS — M6281 Muscle weakness (generalized): Secondary | ICD-10-CM | POA: Diagnosis not present

## 2016-02-02 DIAGNOSIS — R2681 Unsteadiness on feet: Secondary | ICD-10-CM | POA: Diagnosis not present

## 2016-02-07 DIAGNOSIS — R278 Other lack of coordination: Secondary | ICD-10-CM | POA: Diagnosis not present

## 2016-02-07 DIAGNOSIS — R2681 Unsteadiness on feet: Secondary | ICD-10-CM | POA: Diagnosis not present

## 2016-02-07 DIAGNOSIS — M6281 Muscle weakness (generalized): Secondary | ICD-10-CM | POA: Diagnosis not present

## 2016-02-09 DIAGNOSIS — M6281 Muscle weakness (generalized): Secondary | ICD-10-CM | POA: Diagnosis not present

## 2016-02-09 DIAGNOSIS — R2681 Unsteadiness on feet: Secondary | ICD-10-CM | POA: Diagnosis not present

## 2016-02-09 DIAGNOSIS — R278 Other lack of coordination: Secondary | ICD-10-CM | POA: Diagnosis not present

## 2016-02-11 DIAGNOSIS — R2681 Unsteadiness on feet: Secondary | ICD-10-CM | POA: Diagnosis not present

## 2016-02-11 DIAGNOSIS — R278 Other lack of coordination: Secondary | ICD-10-CM | POA: Diagnosis not present

## 2016-02-11 DIAGNOSIS — M6281 Muscle weakness (generalized): Secondary | ICD-10-CM | POA: Diagnosis not present

## 2016-02-21 ENCOUNTER — Ambulatory Visit (INDEPENDENT_AMBULATORY_CARE_PROVIDER_SITE_OTHER): Payer: Medicare Other | Admitting: Family Medicine

## 2016-02-21 ENCOUNTER — Encounter: Payer: Self-pay | Admitting: Family Medicine

## 2016-02-21 VITALS — BP 126/74 | HR 98 | Temp 97.6°F | Wt 128.2 lb

## 2016-02-21 DIAGNOSIS — S0990XD Unspecified injury of head, subsequent encounter: Secondary | ICD-10-CM | POA: Diagnosis not present

## 2016-02-21 DIAGNOSIS — W19XXXD Unspecified fall, subsequent encounter: Secondary | ICD-10-CM

## 2016-02-21 DIAGNOSIS — Y92009 Unspecified place in unspecified non-institutional (private) residence as the place of occurrence of the external cause: Secondary | ICD-10-CM

## 2016-02-21 DIAGNOSIS — W19XXXA Unspecified fall, initial encounter: Secondary | ICD-10-CM | POA: Insufficient documentation

## 2016-02-21 DIAGNOSIS — S0990XA Unspecified injury of head, initial encounter: Secondary | ICD-10-CM | POA: Insufficient documentation

## 2016-02-21 NOTE — Assessment & Plan Note (Signed)
Head CT neg. No further symptoms or work up needed at this point.

## 2016-02-21 NOTE — Progress Notes (Signed)
Subjective:   Patient ID: Lindsey Cline, female    DOB: Feb 23, 1919, 82 y.o.   MRN: [id number]  Lindsey Cline is a pleasant 82 y.o. year old female who presents to clinic today with her son for Follow-up  on 02/21/2016  HPI:  Presented to ED on 01/22/16 after non syncopal fall.  Notes reviewed.  Tripped and fell while getting her laundry at her apartment in ALF.  CT of head neg (she is on eliquis).  Xrays of left knee also negative. No dizziness, SOB or CP prior to fall.  After discharge from ED, transferred to SNF for rehab since she was having a hard time bearing weight on her left leg. Completed one week of PT/OT while at SNF and then completed a week of outpatient PT.  Has no complaints today.  She is now regularly using her walker.  Current Outpatient Prescriptions on File Prior to Visit  Medication Sig Dispense Refill  . apixaban (ELIQUIS) 2.5 MG TABS tablet Take 1 tablet (2.5 mg total) by mouth 2 (two) times daily. 60 tablet 6  . Cyanocobalamin (VITAMIN B-12) 1000 MCG SUBL Place under the tongue every 30 (thirty) days.    Marland Kitchen lisinopril-hydrochlorothiazide (PRINZIDE,ZESTORETIC) 20-12.5 MG per tablet TAKE 1 TABLET BY MOUTH EVERY DAY 90 tablet 1  . metoprolol tartrate (LOPRESSOR) 25 MG tablet Take 1 tablet (25 mg total) by mouth 2 (two) times daily. 60 tablet 3  . Probiotic Product (PROBIOTIC DAILY PO) Take by mouth daily.    . Vitamin D, Ergocalciferol, (DRISDOL) 50000 UNITS CAPS capsule Take 50,000 Units by mouth every 7 (seven) days.     No current facility-administered medications on file prior to visit.    No Known Allergies  Past Medical History  Diagnosis Date  . Macular degeneration   . Hyperlipidemia   . Osteoporosis     osteoarthritis  . A-fib ([place])   . Hypertension   . Heart murmur   . Kidney problem     Past Surgical History  Procedure Laterality Date  . Appendectomy  1942  . Vaginal hysterectomy  1952  . Tonsillectomy  1928    ? Adenoids  .  Inguinal hernia repair  1946    Left  . Cataract extraction  1999    Family History  Problem Relation Age of Onset  . Cancer Mother     colon  . Heart disease Father     Social History   Social History  . Marital Status: Widowed    Spouse Name: N/A  . Number of Children: 1  . Years of Education: N/A   Occupational History  . Housewife    Social History Main Topics  . Smoking status: Never Smoker   . Smokeless tobacco: Never Used  . Alcohol Use: 1.2 oz/week    2 Glasses of wine per week     Comment: WINE 5 DAYS PER WEEK  . Drug Use: No  . Sexual Activity: Not on file   Other Topics Concern  . Not on file   Social History Narrative   Has a living will   Son HPOA   DNR   The PMH, PSH, Social History, Family History, Medications, and allergies have been reviewed in [facility], and have been updated if relevant.   Review of Systems  Constitutional: Negative.   Eyes: Negative.   Respiratory: Negative.   Cardiovascular: Negative.   Musculoskeletal: Negative.   Skin: Negative.   Neurological: Negative.   Hematological: Negative.  Psychiatric/Behavioral: Negative.   All other systems reviewed and are negative.      Objective:    BP 126/74 mmHg  Pulse 98  Temp(Src) 97.6 F (36.4 C) (Oral)  Wt 128 lb 4 oz (58.174 kg)  SpO2 97%   Physical Exam  Constitutional: She is oriented to person, place, and time. She appears well-developed and well-nourished. No distress.  HENT:  Head: Normocephalic.  Eyes: Conjunctivae are normal.  Cardiovascular: Normal rate.   Pulmonary/Chest: Effort normal.  Musculoskeletal: She exhibits no edema or tenderness.  Walking with cane today but usually uses her walker  Neurological: She is alert and oriented to person, place, and time. No cranial nerve deficit.  Skin: Skin is warm and dry. She is not diaphoretic.  Psychiatric: She has a normal mood and affect. Her behavior is normal. Judgment and thought content normal.  Nursing note  and vitals reviewed.         Assessment & Plan:   Head injury, subsequent encounter No Follow-up on file.

## 2016-02-21 NOTE — Progress Notes (Signed)
Pre visit review using our clinic review tool, if applicable. No additional management support is needed unless otherwise documented below in the visit note. 

## 2016-02-21 NOTE — Assessment & Plan Note (Signed)
No further falls, was non syncopal. Completed PT. I did advise daily walks to continue to work on her strength and mobility. The patient indicates understanding of these issues and agrees with the plan.

## 2016-02-24 DIAGNOSIS — D519 Vitamin B12 deficiency anemia, unspecified: Secondary | ICD-10-CM | POA: Diagnosis not present

## 2016-02-24 DIAGNOSIS — I10 Essential (primary) hypertension: Secondary | ICD-10-CM | POA: Diagnosis not present

## 2016-03-09 DIAGNOSIS — Z0000 Encounter for general adult medical examination without abnormal findings: Secondary | ICD-10-CM | POA: Diagnosis not present

## 2016-03-09 DIAGNOSIS — I10 Essential (primary) hypertension: Secondary | ICD-10-CM | POA: Diagnosis not present

## 2016-03-31 ENCOUNTER — Encounter: Payer: Self-pay | Admitting: Cardiovascular Disease

## 2016-03-31 ENCOUNTER — Ambulatory Visit (INDEPENDENT_AMBULATORY_CARE_PROVIDER_SITE_OTHER): Payer: Medicare Other | Admitting: Cardiovascular Disease

## 2016-03-31 VITALS — BP 132/64 | HR 81 | Ht 65.0 in | Wt 130.5 lb

## 2016-03-31 DIAGNOSIS — I482 Chronic atrial fibrillation, unspecified: Secondary | ICD-10-CM

## 2016-03-31 NOTE — Progress Notes (Signed)
Cardiology Office Note   Date:  03/31/2016   ID:  Lindsey, Cline 18-Dec-1918, MRN [id number]  PCP:  Arnette Norris, MD  Cardiologist:   Kathlyn Sacramento, MD   Chief Complaint  Patient presents with  . other    6 month follow up. Meds reviewed by the patient verbally. "doing well."       History of Present Illness: Lindsey Cline is a 82 y.o. female who presents for a follow-up visit regarding chronic atrial fibrillation.  Echocardiogram in April 2016 showed normal LV systolic function, moderate mitral regurgitation, severely dilated left atrium and mildly dilated right atrium.  She has been treated with rate control with metoprolol and anticoagulation with low dose Eliquis given stage IV chronic kidney disease. She lives in [place] assisted living facility. She has been doing very well and denies any chest pain, shortness of breath or palpitations. Recent labs showed unremarkable CBC. Creatinine has been stable with most recent creatinine of 1.66.  She fell a few months ago but did not have syncope. It was a mechanical fall. She was sent to short-term rehabilitation with improvement. She is currently walking with a walker and has not had any recurrent episodes.   Past Medical History  Diagnosis Date  . Macular degeneration   . Hyperlipidemia   . Osteoporosis     osteoarthritis  . A-fib ([place])   . Hypertension   . Heart murmur   . Kidney problem     Past Surgical History  Procedure Laterality Date  . Appendectomy  1942  . Vaginal hysterectomy  1952  . Tonsillectomy  1928    ? Adenoids  . Inguinal hernia repair  1946    Left  . Cataract extraction  1999     Current Outpatient Prescriptions  Medication Sig Dispense Refill  . apixaban (ELIQUIS) 2.5 MG TABS tablet Take 1 tablet (2.5 mg total) by mouth 2 (two) times daily. 60 tablet 6  . Cyanocobalamin (VITAMIN B-12) 1000 MCG SUBL Place under the tongue every 30 (thirty) days.    Marland Kitchen lisinopril-hydrochlorothiazide  (PRINZIDE,ZESTORETIC) 20-12.5 MG per tablet TAKE 1 TABLET BY MOUTH EVERY DAY 90 tablet 1  . metoprolol tartrate (LOPRESSOR) 25 MG tablet Take 1 tablet (25 mg total) by mouth 2 (two) times daily. 60 tablet 3  . Probiotic Product (PROBIOTIC DAILY PO) Take by mouth daily.    . Vitamin D, Ergocalciferol, (DRISDOL) 50000 UNITS CAPS capsule Take 50,000 Units by mouth every 7 (seven) days.     No current facility-administered medications for this visit.    Allergies:   Review of patient's allergies indicates no known allergies.    Social History:  The patient  reports that she has never smoked. She has never used smokeless tobacco. She reports that she drinks about 1.2 oz of alcohol per week. She reports that she does not use illicit drugs.   Family History:  The patient's family history includes Cancer in her mother; Heart disease in her father.    ROS:  Please see the history of present illness.   Otherwise, review of systems are positive for none.   All other systems are reviewed and negative.    PHYSICAL EXAM: VS:  Ht 5\' 5"  (1.651 m)  Wt 130 lb 8 oz (59.194 kg)  BMI 21.72 kg/m2 , BMI Body mass index is 21.72 kg/(m^2). GEN: Well nourished, well developed, in no acute distress HEENT: normal Neck: no JVD, carotid bruits, or masses Cardiac: Irregularly irregular   ;  no murmurs, rubs, or gallops,no edema  Respiratory:  clear to auscultation bilaterally, normal work of breathing GI: soft, nontender, nondistended, + BS MS: no deformity or atrophy Skin: warm and dry, no rash Neuro:  Strength and sensation are intact Psych: euthymic mood, full affect   EKG:  EKG is ordered today. The ekg ordered today demonstrates  atrial fibrillation with left axis deviation. ST and T wave changes suggestive of lateral ischemia.   Recent Labs: 12/23/2015: ALT 17; BUN 33*; Creatinine, Ser 1.66*; Potassium 4.5; Sodium 139; TSH 1.86    Lipid Panel    Component Value Date/Time   CHOL 267* 12/23/2015  1029   TRIG 94.0 12/23/2015 1029   HDL 71.50 12/23/2015 1029   CHOLHDL 4 12/23/2015 1029   VLDL 18.8 12/23/2015 1029   LDLCALC 176* 12/23/2015 1029   LDLDIRECT 128.8 05/29/2008 1251      Wt Readings from Last 3 Encounters:  03/31/16 130 lb 8 oz (59.194 kg)  02/21/16 128 lb 4 oz (58.174 kg)  01/22/16 130 lb (58.968 kg)        ASSESSMENT AND PLAN:  1.  Chronic atrial fibrillation: Ventricular rate is well controlled on metoprolol. Continue long-term anticoagulation. She has no signs of bleeding. She had only one episode of falling but her balance has improved significantly with physical therapy and using a walker.  2. Essential hypertension: Blood pressure is reasonably controlled on current medications.    Disposition:   FU with me in 6 months  Signed,  Kathlyn Sacramento, MD  03/31/2016 11:11 AM    [place]

## 2016-03-31 NOTE — Patient Instructions (Signed)

## 2016-04-12 DIAGNOSIS — H353222 Exudative age-related macular degeneration, left eye, with inactive choroidal neovascularization: Secondary | ICD-10-CM | POA: Diagnosis not present

## 2016-05-03 DIAGNOSIS — I10 Essential (primary) hypertension: Secondary | ICD-10-CM | POA: Diagnosis not present

## 2016-05-03 DIAGNOSIS — R809 Proteinuria, unspecified: Secondary | ICD-10-CM | POA: Diagnosis not present

## 2016-05-03 DIAGNOSIS — N184 Chronic kidney disease, stage 4 (severe): Secondary | ICD-10-CM | POA: Diagnosis not present

## 2016-05-03 DIAGNOSIS — D519 Vitamin B12 deficiency anemia, unspecified: Secondary | ICD-10-CM | POA: Diagnosis not present

## 2016-05-12 DIAGNOSIS — R809 Proteinuria, unspecified: Secondary | ICD-10-CM | POA: Diagnosis not present

## 2016-05-12 DIAGNOSIS — N184 Chronic kidney disease, stage 4 (severe): Secondary | ICD-10-CM | POA: Diagnosis not present

## 2016-05-12 DIAGNOSIS — I10 Essential (primary) hypertension: Secondary | ICD-10-CM | POA: Diagnosis not present

## 2016-05-12 DIAGNOSIS — N2581 Secondary hyperparathyroidism of renal origin: Secondary | ICD-10-CM | POA: Diagnosis not present

## 2016-06-14 DIAGNOSIS — E538 Deficiency of other specified B group vitamins: Secondary | ICD-10-CM | POA: Diagnosis not present

## 2016-06-14 DIAGNOSIS — G3184 Mild cognitive impairment, so stated: Secondary | ICD-10-CM | POA: Diagnosis not present

## 2016-07-12 DIAGNOSIS — H353222 Exudative age-related macular degeneration, left eye, with inactive choroidal neovascularization: Secondary | ICD-10-CM | POA: Diagnosis not present

## 2016-08-08 DIAGNOSIS — Z23 Encounter for immunization: Secondary | ICD-10-CM | POA: Diagnosis not present

## 2016-08-12 ENCOUNTER — Encounter: Payer: Self-pay | Admitting: Family Medicine

## 2016-09-12 DIAGNOSIS — R809 Proteinuria, unspecified: Secondary | ICD-10-CM | POA: Diagnosis not present

## 2016-09-12 DIAGNOSIS — I10 Essential (primary) hypertension: Secondary | ICD-10-CM | POA: Diagnosis not present

## 2016-09-12 DIAGNOSIS — N184 Chronic kidney disease, stage 4 (severe): Secondary | ICD-10-CM | POA: Diagnosis not present

## 2016-09-20 DIAGNOSIS — N2581 Secondary hyperparathyroidism of renal origin: Secondary | ICD-10-CM | POA: Diagnosis not present

## 2016-09-20 DIAGNOSIS — N183 Chronic kidney disease, stage 3 (moderate): Secondary | ICD-10-CM | POA: Diagnosis not present

## 2016-09-20 DIAGNOSIS — I10 Essential (primary) hypertension: Secondary | ICD-10-CM | POA: Diagnosis not present

## 2016-09-20 DIAGNOSIS — R809 Proteinuria, unspecified: Secondary | ICD-10-CM | POA: Diagnosis not present

## 2016-09-28 ENCOUNTER — Ambulatory Visit (INDEPENDENT_AMBULATORY_CARE_PROVIDER_SITE_OTHER): Payer: Medicare Other | Admitting: Cardiovascular Disease

## 2016-09-28 ENCOUNTER — Encounter: Payer: Self-pay | Admitting: Cardiovascular Disease

## 2016-09-28 VITALS — BP 138/70 | HR 91 | Ht 65.0 in | Wt 134.5 lb

## 2016-09-28 DIAGNOSIS — I482 Chronic atrial fibrillation, unspecified: Secondary | ICD-10-CM

## 2016-09-28 DIAGNOSIS — I10 Essential (primary) hypertension: Secondary | ICD-10-CM

## 2016-09-28 NOTE — Progress Notes (Signed)
Cardiology Office Note   Date:  09/28/2016   ID:  Zahari, Fazzino 1918/11/27, MRN [id number]  PCP:  Arnette Norris, MD  Cardiologist:   Kathlyn Sacramento, MD   Chief Complaint  Patient presents with  . other    6 month f/u c/o sob with exertion. Meds reviewed verbally with pt.      History of Present Illness: Lindsey Cline is a 82 y.o. female who presents for a follow-up visit regarding chronic atrial fibrillation.  Echocardiogram in April 2016 showed normal LV systolic function, moderate mitral regurgitation, severely dilated left atrium and mildly dilated right atrium.  She has been treated with rate control with metoprolol and anticoagulation with low dose Eliquis given stage IV chronic kidney disease. She lives in [facility] facility. She has been doing very well and denies any chest pain, shortness of breath or palpitations. No recent falls  Past Medical History:  Diagnosis Date  . A-fib ([place])   . Heart murmur   . Hyperlipidemia   . Hypertension   . Kidney problem   . Macular degeneration   . Osteoporosis    osteoarthritis    Past Surgical History:  Procedure Laterality Date  . APPENDECTOMY  1942  . CATARACT EXTRACTION  1999  . INGUINAL HERNIA REPAIR  1946   Left  . TONSILLECTOMY  1928   ? Adenoids  . VAGINAL HYSTERECTOMY  1952     Current Outpatient Prescriptions  Medication Sig Dispense Refill  . apixaban (ELIQUIS) 2.5 MG TABS tablet Take 1 tablet (2.5 mg total) by mouth 2 (two) times daily. 60 tablet 6  . hydroxypropyl methylcellulose / hypromellose (ISOPTO TEARS / GONIOVISC) 2.5 % ophthalmic solution 1 drop 3 (three) times daily as needed for dry eyes.    Marland Kitchen lisinopril-hydrochlorothiazide (PRINZIDE,ZESTORETIC) 20-12.5 MG per tablet TAKE 1 TABLET BY MOUTH EVERY DAY 90 tablet 1  . metoprolol tartrate (LOPRESSOR) 25 MG tablet Take 1 tablet (25 mg total) by mouth 2 (two) times daily. 60 tablet 3  . Probiotic Product (PROBIOTIC DAILY PO)  Take by mouth daily.    . Vitamin D, Ergocalciferol, (DRISDOL) 50000 UNITS CAPS capsule Take 50,000 Units by mouth every 7 (seven) days.     No current facility-administered medications for this visit.     Allergies:   Patient has no known allergies.    Social History:  The patient  reports that she has never smoked. She has never used smokeless tobacco. She reports that she drinks about 1.2 oz of alcohol per week . She reports that she does not use drugs.   Family History:  The patient's family history includes Cancer in her mother; Heart disease in her father.    ROS:  Please see the history of present illness.   Otherwise, review of systems are positive for none.   All other systems are reviewed and negative.    PHYSICAL EXAM: VS:  BP 138/70 (BP Location: Left Arm, Patient Position: Sitting, Cuff Size: Normal)   Pulse 91   Ht 5\' 5"  (1.651 m)   Wt 134 lb 8 oz (61 kg)   BMI 22.38 kg/m  , BMI Body mass index is 22.38 kg/m. GEN: Well nourished, well developed, in no acute distress  HEENT: normal  Neck: no JVD, carotid bruits, or masses Cardiac: Irregularly irregular   ; no murmurs, rubs, or gallops,no edema  Respiratory:  clear to auscultation bilaterally, normal work of breathing GI: soft, nontender, nondistended, + BS MS:  no deformity or atrophy  Skin: warm and dry, no rash Neuro:  Strength and sensation are intact Psych: euthymic mood, full affect   EKG:  EKG is ordered today. The ekg ordered today demonstrates  atrial fibrillation with left axis deviation. ST and T wave changes suggestive of lateral ischemia.   Recent Labs: 12/23/2015: ALT 17; BUN 33; Creatinine, Ser 1.66; Potassium 4.5; Sodium 139; TSH 1.86    Lipid Panel    Component Value Date/Time   CHOL 267 (H) 12/23/2015 1029   TRIG 94.0 12/23/2015 1029   HDL 71.50 12/23/2015 1029   CHOLHDL 4 12/23/2015 1029   VLDL 18.8 12/23/2015 1029   LDLCALC 176 (H) 12/23/2015 1029   LDLDIRECT 128.8 05/29/2008 1251       Wt Readings from Last 3 Encounters:  09/28/16 134 lb 8 oz (61 kg)  03/31/16 130 lb 8 oz (59.2 kg)  02/21/16 128 lb 4 oz (58.2 kg)        ASSESSMENT AND PLAN:  1.  Chronic atrial fibrillation: Ventricular rate is well controlled on metoprolol. Continue long-term anticoagulation. She has no signs of bleeding.  I reviewed recent labs which showed a hemoglobin of 12.2 and a creatinine 1.44.  2. Essential hypertension: Blood pressure is reasonably controlled on current medications.    Disposition:   FU with me in 6 months  Signed,  Kathlyn Sacramento, MD  09/28/2016 11:07 AM    [place]

## 2016-09-28 NOTE — Patient Instructions (Signed)
Medication Instructions: Continue same medications.   Labwork: None.   Procedures/Testing: None.   Follow-Up: 6 months with Dr. Rhia Blatchford.   Any Additional Special Instructions Will Be Listed Below (If Applicable).     If you need a refill on your cardiac medications before your next appointment, please call your pharmacy.   

## 2016-10-04 DIAGNOSIS — H353222 Exudative age-related macular degeneration, left eye, with inactive choroidal neovascularization: Secondary | ICD-10-CM | POA: Diagnosis not present

## 2016-12-12 DIAGNOSIS — E538 Deficiency of other specified B group vitamins: Secondary | ICD-10-CM | POA: Diagnosis not present

## 2016-12-12 DIAGNOSIS — G3184 Mild cognitive impairment, so stated: Secondary | ICD-10-CM | POA: Diagnosis not present

## 2016-12-18 ENCOUNTER — Ambulatory Visit: Payer: Medicare Other | Admitting: Podiatry

## 2017-01-01 ENCOUNTER — Other Ambulatory Visit: Payer: Self-pay | Admitting: Family Medicine

## 2017-01-01 DIAGNOSIS — N183 Chronic kidney disease, stage 3 unspecified: Secondary | ICD-10-CM

## 2017-01-01 DIAGNOSIS — E785 Hyperlipidemia, unspecified: Secondary | ICD-10-CM

## 2017-01-03 ENCOUNTER — Ambulatory Visit (INDEPENDENT_AMBULATORY_CARE_PROVIDER_SITE_OTHER): Payer: Medicare Other

## 2017-01-03 VITALS — BP 122/74 | HR 102 | Temp 98.0°F | Ht 60.5 in | Wt 137.5 lb

## 2017-01-03 DIAGNOSIS — Z0000 Encounter for general adult medical examination without abnormal findings: Secondary | ICD-10-CM | POA: Diagnosis not present

## 2017-01-03 DIAGNOSIS — E785 Hyperlipidemia, unspecified: Secondary | ICD-10-CM | POA: Diagnosis not present

## 2017-01-03 DIAGNOSIS — N183 Chronic kidney disease, stage 3 unspecified: Secondary | ICD-10-CM

## 2017-01-03 LAB — COMPREHENSIVE METABOLIC PANEL
ALT: 15 U/L (ref 0–35)
AST: 21 U/L (ref 0–37)
Albumin: 4 g/dL (ref 3.5–5.2)
Alkaline Phosphatase: 85 U/L (ref 39–117)
BUN: 30 mg/dL — ABNORMAL HIGH (ref 6–23)
CHLORIDE: 103 meq/L (ref 96–112)
CO2: 30 meq/L (ref 19–32)
Calcium: 9 mg/dL (ref 8.4–10.5)
Creatinine, Ser: 1.4 mg/dL — ABNORMAL HIGH (ref 0.40–1.20)
GFR: 37.1 mL/min — AB (ref >60.00)
Glucose, Bld: 108 mg/dL — ABNORMAL HIGH (ref 70–99)
POTASSIUM: 4.3 meq/L (ref 3.5–5.1)
SODIUM: 140 meq/L (ref 135–145)
Total Bilirubin: 0.5 mg/dL (ref 0.2–1.2)
Total Protein: 6.8 g/dL (ref 6.0–8.3)

## 2017-01-03 LAB — LIPID PANEL
Cholesterol: 233 mg/dL — ABNORMAL HIGH (ref 0–200)
HDL: 64.6 mg/dL (ref >39.00)
LDL Cholesterol: 155 mg/dL — ABNORMAL HIGH (ref 0–99)
NONHDL: 168.41
TRIGLYCERIDES: 68 mg/dL (ref 0.0–149.0)
Total CHOL/HDL Ratio: 4
VLDL: 13.6 mg/dL (ref 0.0–40.0)

## 2017-01-03 LAB — TSH: TSH: 1.74 u[IU]/mL (ref 0.35–4.50)

## 2017-01-03 LAB — VITAMIN D 25 HYDROXY (VIT D DEFICIENCY, FRACTURES): VITD: 56.25 ng/mL (ref 30.00–100.00)

## 2017-01-03 NOTE — Progress Notes (Signed)
Subjective:   Lindsey Cline is a 83 y.o. female who presents for Medicare Annual (Subsequent) preventive examination.  Review of Systems:  N/A Cardiac Risk Factors include: advanced age (>36men, >43 women);dyslipidemia;hypertension     Objective:     Vitals: BP 122/74 (BP Location: Right Arm, Patient Position: Sitting, Cuff Size: Normal)   Pulse (!) 102   Temp 98 F (36.7 C) (Oral)   Ht 5' 0.5" (1.537 m) Comment: shoes  Wt 137 lb 8 oz (62.4 kg)   SpO2 97%   BMI 26.41 kg/m   Body mass index is 26.41 kg/m.   Tobacco History  Smoking Status  . Never Smoker  Smokeless Tobacco  . Never Used     Counseling given: No   Past Medical History:  Diagnosis Date  . A-fib ([place])   . Heart murmur   . Hyperlipidemia   . Hypertension   . Kidney problem   . Macular degeneration   . Osteoporosis    osteoarthritis   Past Surgical History:  Procedure Laterality Date  . APPENDECTOMY  1942  . CATARACT EXTRACTION  1999  . INGUINAL HERNIA REPAIR  1946   Left  . TONSILLECTOMY  1928   ? Adenoids  . VAGINAL HYSTERECTOMY  1952   Family History  Problem Relation Age of Onset  . Cancer Mother     colon  . Heart disease Father    History  Sexual Activity  . Sexual activity: No    Outpatient Encounter Prescriptions as of 01/03/2017  Medication Sig  . apixaban (ELIQUIS) 2.5 MG TABS tablet Take 1 tablet (2.5 mg total) by mouth 2 (two) times daily.  . hydroxypropyl methylcellulose / hypromellose (ISOPTO TEARS / GONIOVISC) 2.5 % ophthalmic solution 1 drop 3 (three) times daily as needed for dry eyes.  Marland Kitchen lisinopril-hydrochlorothiazide (PRINZIDE,ZESTORETIC) 20-12.5 MG per tablet TAKE 1 TABLET BY MOUTH EVERY DAY  . metoprolol tartrate (LOPRESSOR) 25 MG tablet Take 1 tablet (25 mg total) by mouth 2 (two) times daily.  . Probiotic Product (PROBIOTIC DAILY PO) Take by mouth daily.  . [DISCONTINUED] Vitamin D, Ergocalciferol, (DRISDOL) 50000 UNITS CAPS capsule Take 50,000 Units by  mouth every 7 (seven) days.   No facility-administered encounter medications on file as of 01/03/2017.     Activities of Daily Living In your present state of health, do you have any difficulty performing the following activities: 01/03/2017  Hearing? N  Vision? N  Difficulty concentrating or making decisions? N  Walking or climbing stairs? N  Dressing or bathing? N  Doing errands, shopping? N  Preparing Food and eating ? N  Using the Toilet? N  In the past six months, have you accidently leaked urine? N  Do you have problems with loss of bowel control? N  Managing your Medications? N  Managing your Finances? N  Housekeeping or managing your Housekeeping? N  Some recent data might be hidden    Patient Care Team: Lucille Passy, MD as PCP - General (Family Medicine) Max Villa Herb, DPM as Consulting Physician (Podiatry) Anthonette Legato, MD (Internal Medicine) Wellington Hampshire, MD as Consulting Physician (Cardiology)    Assessment:    Hearing Screening Comments: Bilateral hearing aids Vision Screening Comments: Last vision exam in December 2017 with Dr. George Ina  Exercise Activities and Dietary recommendations Current Exercise Habits: Home exercise routine, Type of exercise: walking, Time (Minutes): 15, Frequency (Times/Week): 7, Weekly Exercise (Minutes/Week): 105, Intensity: Mild, Exercise limited by: None identified  Goals    .  Increase physical activity          Starting 01/03/2017, I will continue to walk at least 15 min daily.       Fall Risk Fall Risk  01/03/2017 12/30/2015 12/28/2014  Falls in the past year? No No Yes  Number falls in past yr: - - 1  Injury with Fall? - - No   Depression Screen PHQ 2/9 Scores 01/03/2017 12/30/2015 12/28/2014  PHQ - 2 Score 0 0 0     Cognitive Function MMSE - Mini Mental State Exam 01/03/2017  Orientation to time 5  Orientation to Place 5  Registration 3  Attention/ Calculation 0  Recall 3  Language- name 2 objects 0  Language-  repeat 1  Language- follow 3 step command 2  Language- follow 3 step command-comments pt was unable to follow 1 step of 3 step command  Language- read & follow direction 0  Write a sentence 0  Copy design 0  Total score 19       PLEASE NOTE: A Mini-Cog screen was completed. Maximum score is 20. A value of 0 denotes this part of [place] MMSE was not completed or the patient failed this part of the Mini-Cog screening.   Mini-Cog Screening Orientation to Time - Max 5 pts Orientation to Place - Max 5 pts Registration - Max 3 pts Recall - Max 3 pts Language Repeat - Max 1 pts Language Follow 3 Step Command - Max 3 pts   Immunization History  Administered Date(s) Administered  . Influenza Split 07/07/2011  . Influenza Whole 07/21/2010, 06/29/2012  . Influenza, High Dose Seasonal PF 07/15/2014  . Pneumococcal Conjugate-13 12/28/2014  . Pneumococcal Polysaccharide-23 05/10/2007  . Td 05/10/2007  . Zoster 01/18/2015   Screening Tests Health Maintenance  Topic Date Due  . DEXA SCAN  01/03/2026 (Originally 07/27/1986)  . TETANUS/TDAP  05/09/2017  . INFLUENZA VACCINE  Addressed  . PNA vac Low Risk Adult  Completed      Plan:     I have personally reviewed and addressed the Medicare Annual Wellness questionnaire and have noted the following in the patient's chart:  A. Medical and social history B. Use of alcohol, tobacco or illicit drugs  C. Current medications and supplements D. Functional ability and status E.  Nutritional status F.  Physical activity G. Advance directives H. List of other physicians I.  Hospitalizations, surgeries, and ER visits in previous 12 months J.  [place] to include hearing, vision, cognitive, depression L. Referrals and appointments - none  In addition, I have reviewed and discussed with patient certain preventive protocols, quality metrics, and best practice recommendations. A written personalized care plan for preventive services  as well as general preventive health recommendations were provided to patient.  See attached scanned questionnaire for additional information.   Signed,   Lindell Noe, MHA, BS, LPN Health Coach

## 2017-01-03 NOTE — Progress Notes (Signed)
I reviewed health advisor's note, was available for consultation, and agree with documentation and plan.  

## 2017-01-03 NOTE — Patient Instructions (Signed)
Lindsey Cline , Thank you for taking time to come for your Medicare Wellness Visit. I appreciate your ongoing commitment to your health goals. Please review the following plan we discussed and let me know if I can assist you in the future.   These are the goals we discussed: Goals    . Increase physical activity          Starting 01/03/2017, I will continue to walk at least 15 min daily.        This is a list of the screening recommended for you and due dates:  Health Maintenance  Topic Date Due  . DEXA scan (bone density measurement)  01/03/2026*  . Tetanus Vaccine  05/09/2017  . Flu Shot  Addressed  . Pneumonia vaccines  Completed  *Topic was postponed. The date shown is not the original due date.   Preventive Care for Adults  A healthy lifestyle and preventive care can promote health and wellness. Preventive health guidelines for adults include the following key practices.  . A routine yearly physical is a good way to check with your health care provider about your health and preventive screening. It is a chance to share any concerns and updates on your health and to receive a thorough exam.  . Visit your dentist for a routine exam and preventive care every 6 months. Brush your teeth twice a day and floss once a day. Good oral hygiene prevents tooth decay and gum disease.  . The frequency of eye exams is based on your age, health, family medical history, use  of contact lenses, and other factors. Follow your health care provider's ecommendations for frequency of eye exams.  . Eat a healthy diet. Foods like vegetables, fruits, whole grains, low-fat dairy products, and lean protein foods contain the nutrients you need without too many calories. Decrease your intake of foods high in solid fats, added sugars, and salt. Eat the right amount of calories for you. Get information about a proper diet from your health care provider, if necessary.  . Regular physical exercise is one of the  most important things you can do for your health. Most adults should get at least 150 minutes of moderate-intensity exercise (any activity that increases your heart rate and causes you to sweat) each week. In addition, most adults need muscle-strengthening exercises on 2 or more days a week.  Silver Sneakers may be a benefit available to you. To determine eligibility, you may visit the website: www.silversneakers.com or contact program at 682-488-3204 Mon-Fri between 8AM-8PM.   . Maintain a healthy weight. The body mass index (BMI) is a screening tool to identify possible weight problems. It provides an estimate of body fat based on height and weight. Your health care provider can find your BMI and can help you achieve or maintain a healthy weight.   For adults 20 years and older: ? A BMI below 18.5 is considered underweight. ? A BMI of 18.5 to 24.9 is normal. ? A BMI of 25 to 29.9 is considered overweight. ? A BMI of 30 and above is considered obese.   . Maintain normal blood lipids and cholesterol levels by exercising and minimizing your intake of saturated fat. Eat a balanced diet with plenty of fruit and vegetables. Blood tests for lipids and cholesterol should begin at age 28 and be repeated every 5 years. If your lipid or cholesterol levels are high, you are over 50, or you are at high risk for heart disease, you may need  your cholesterol levels checked more frequently. Ongoing high lipid and cholesterol levels should be treated with medicines if diet and exercise are not working.  . If you smoke, find out from your health care provider how to quit. If you do not use tobacco, please do not start.  . If you choose to drink alcohol, please do not consume more than 2 drinks per day. One drink is considered to be 12 ounces (355 mL) of beer, 5 ounces (148 mL) of wine, or 1.5 ounces (44 mL) of liquor.  . If you are 77-61 years old, ask your health care provider if you should take aspirin to  prevent strokes.  . Use sunscreen. Apply sunscreen liberally and repeatedly throughout the day. You should seek shade when your shadow is shorter than you. Protect yourself by wearing long sleeves, pants, a wide-brimmed hat, and sunglasses year round, whenever you are outdoors.  . Once a month, do a whole body skin exam, using a mirror to look at the skin on your back. Tell your health care provider of new moles, moles that have irregular borders, moles that are larger than a pencil eraser, or moles that have changed in shape or color.

## 2017-01-03 NOTE — Progress Notes (Signed)
Pre visit review using our clinic review tool, if applicable. No additional management support is needed unless otherwise documented below in the visit note. 

## 2017-01-03 NOTE — Progress Notes (Signed)
PCP notes:   Health maintenance:  Bone density - no longer indicated   Abnormal screenings:   Mini-Cog score: 19 Fall risk - hx of fall with injury  Patient concerns:   None  Nurse concerns:  None  Next PCP appt:   01/08/17 @ 0915

## 2017-01-08 ENCOUNTER — Ambulatory Visit (INDEPENDENT_AMBULATORY_CARE_PROVIDER_SITE_OTHER): Payer: Medicare Other | Admitting: Family Medicine

## 2017-01-08 ENCOUNTER — Ambulatory Visit (INDEPENDENT_AMBULATORY_CARE_PROVIDER_SITE_OTHER): Payer: Medicare Other | Admitting: Podiatry

## 2017-01-08 ENCOUNTER — Encounter: Payer: Self-pay | Admitting: Podiatry

## 2017-01-08 VITALS — BP 122/74 | HR 89 | Ht 65.0 in | Wt 137.2 lb

## 2017-01-08 DIAGNOSIS — I4891 Unspecified atrial fibrillation: Secondary | ICD-10-CM | POA: Diagnosis not present

## 2017-01-08 DIAGNOSIS — H3530 Unspecified macular degeneration: Secondary | ICD-10-CM | POA: Diagnosis not present

## 2017-01-08 DIAGNOSIS — N183 Chronic kidney disease, stage 3 unspecified: Secondary | ICD-10-CM

## 2017-01-08 DIAGNOSIS — I10 Essential (primary) hypertension: Secondary | ICD-10-CM

## 2017-01-08 DIAGNOSIS — H9190 Unspecified hearing loss, unspecified ear: Secondary | ICD-10-CM | POA: Diagnosis not present

## 2017-01-08 DIAGNOSIS — E785 Hyperlipidemia, unspecified: Secondary | ICD-10-CM

## 2017-01-08 DIAGNOSIS — B351 Tinea unguium: Secondary | ICD-10-CM

## 2017-01-08 DIAGNOSIS — M79676 Pain in unspecified toe(s): Secondary | ICD-10-CM

## 2017-01-08 DIAGNOSIS — L608 Other nail disorders: Secondary | ICD-10-CM

## 2017-01-08 NOTE — Assessment & Plan Note (Signed)
Refer to audiology for hearing aid evaluation.

## 2017-01-08 NOTE — Assessment & Plan Note (Signed)
Cr stable, also followed by renal.

## 2017-01-08 NOTE — Assessment & Plan Note (Signed)
Rate controlled, on eliquis.

## 2017-01-08 NOTE — Progress Notes (Signed)
Subjective:   Patient ID: Lindsey Cline, female    DOB: [birth year], 83 y.o.   MRN: [id number]  Lindsey Cline is a pleasant 83 y.o. year old female who presents to clinic today with her son for Medicare Wellness (part 2)  on 01/08/2017  HPI:  Annual medicare wellness visit with Candis Musa, RN on 01/03/17. Notes reviewed.  Afib- followed by Dr. Fletcher Anon.  Last saw him on 09/28/16.  Note reviewed. Advised continued eliquis and beta blocker.  HTN-  Has been on Lisinopril-HCTZ 20-12.5 mg daily for years and now on lopressor as well due to afib.    Denies any HA, blurred vision, CP or SOB. Remains very active- takes the stairs everyday and walks down the hallway.  She is having more trouble hearing and failed the hearing screen.  Lab Results  Component Value Date   CREATININE 1.40 (H) 01/03/2017   Lab Results  Component Value Date   CHOL 233 (H) 01/03/2017   HDL 64.60 01/03/2017   LDLCALC 155 (H) 01/03/2017   LDLDIRECT 128.8 05/29/2008   TRIG 68.0 01/03/2017   CHOLHDL 4 01/03/2017   Lab Results  Component Value Date   ALT 15 01/03/2017   AST 21 01/03/2017   ALKPHOS 85 01/03/2017   BILITOT 0.5 01/03/2017   Current Outpatient Prescriptions on File Prior to Visit  Medication Sig Dispense Refill  . apixaban (ELIQUIS) 2.5 MG TABS tablet Take 1 tablet (2.5 mg total) by mouth 2 (two) times daily. 60 tablet 6  . hydroxypropyl methylcellulose / hypromellose (ISOPTO TEARS / GONIOVISC) 2.5 % ophthalmic solution 1 drop 3 (three) times daily as needed for dry eyes.    Marland Kitchen lisinopril-hydrochlorothiazide (PRINZIDE,ZESTORETIC) 20-12.5 MG per tablet TAKE 1 TABLET BY MOUTH EVERY DAY 90 tablet 1  . metoprolol tartrate (LOPRESSOR) 25 MG tablet Take 1 tablet (25 mg total) by mouth 2 (two) times daily. 60 tablet 3  . Probiotic Product (PROBIOTIC DAILY PO) Take by mouth daily.     No current facility-administered medications on file prior to visit.     No Known Allergies  Past Medical  History:  Diagnosis Date  . A-fib ([place])   . Heart murmur   . Hyperlipidemia   . Hypertension   . Kidney problem   . Macular degeneration   . Osteoporosis    osteoarthritis    Past Surgical History:  Procedure Laterality Date  . APPENDECTOMY  1942  . CATARACT EXTRACTION  1999  . INGUINAL HERNIA REPAIR  1946   Left  . TONSILLECTOMY  1928   ? Adenoids  . VAGINAL HYSTERECTOMY  1952    Family History  Problem Relation Age of Onset  . Cancer Mother     colon  . Heart disease Father     Social History   Social History  . Marital status: Widowed    Spouse name: N/A  . Number of children: 1  . Years of education: N/A   Occupational History  . Housewife    Social History Main Topics  . Smoking status: Never Smoker  . Smokeless tobacco: Never Used  . Alcohol use 1.2 oz/week    2 Glasses of wine per week     Comment: WINE 5 DAYS PER WEEK  . Drug use: No  . Sexual activity: No   Other Topics Concern  . Not on file   Social History Narrative   Has a living will   Son HPOA   DNR   The PMH, [place],  Social History, Family History, Medications, and allergies have been reviewed in [facility], and have been updated if relevant.   Review of Systems  Constitutional: Negative.   HENT: Positive for hearing loss. Negative for ear discharge and ear pain.   Eyes: Negative.   Respiratory: Negative.   Cardiovascular: Negative.   Gastrointestinal: Negative.   Endocrine: Negative.   Genitourinary: Negative.   Musculoskeletal: Negative.   Neurological: Negative.   Psychiatric/Behavioral: Negative.   All other systems reviewed and are negative.      Objective:    BP 122/74   Pulse 89   Ht 5\' 5"  (1.651 m)   Wt 137 lb 4 oz (62.3 kg)   SpO2 96%   BMI 22.84 kg/m    Physical Exam   General:  Well-developed,well-nourished,in no acute distress; alert,appropriate and cooperative throughout examination Head:  normocephalic and atraumatic.   Eyes:  vision grossly intact,  PERRL Ears:  R ear normal and L ear normal externally, TMs clear bilaterally Nose:  no external deformity.   Mouth:  good dentition.   Neck:  No deformities, masses, or tenderness noted. Lungs:  Normal respiratory effort, chest expands symmetrically. Lungs are clear to auscultation, no crackles or wheezes. Heart:  Irregularly irregular. Msk:  No deformity or scoliosis noted of thoracic or lumbar spine.   Extremities:  No clubbing, cyanosis, edema, or deformity noted with normal full range of motion of all joints.   Neurologic:  alert & oriented X3 and gait normal.   Skin:  Intact without suspicious lesions or rashes Psych:  Cognition and judgment appear intact. Alert and cooperative with normal attention span and concentration. No apparent delusions, illusions, hallucinations       Assessment & Plan:   Hearing loss, unspecified hearing loss type, unspecified laterality - Plan: Ambulatory referral to Audiology  Hyperlipidemia, unspecified hyperlipidemia type  Macular degeneration (senile) of retina  CKD (chronic kidney disease), stage III No Follow-up on file.

## 2017-01-08 NOTE — Assessment & Plan Note (Signed)
Reasonable control given age. No changes made.

## 2017-01-08 NOTE — Assessment & Plan Note (Signed)
Well controlled.  No changes made. 

## 2017-01-08 NOTE — Progress Notes (Signed)
Complaint:  Visit Type: Patient returns to my office for continued preventative foot care services. Complaint: Patient states" my nails have grown long and thick and become painful to walk and wear shoes" The patient presents for preventative foot care services. No changes to ROS  Podiatric Exam: Vascular: dorsalis pedis and posterior tibial pulses are palpable bilateral. Capillary return is immediate. Temperature gradient is WNL. Skin turgor WNL  Sensorium: Normal Semmes Weinstein monofilament test. Normal tactile sensation bilaterally. Nail Exam: Pt has thick disfigured discolored nails with subungual debris noted bilateral entire nail hallux through fifth toenails.  Pincer nails  B/L Ulcer Exam: There is no evidence of ulcer or pre-ulcerative changes or infection. Orthopedic Exam: Muscle tone and strength are WNL. No limitations in general ROM. No crepitus or effusions noted. Foot type and digits show no abnormalities. Bony prominences are unremarkable. Skin: No Porokeratosis. No infection or ulcers  Diagnosis:  Onychomycosis, , Pain in right toe, pain in left toes  Treatment & Plan Procedures and Treatment: Consent by patient was obtained for treatment procedures. The patient understood the discussion of treatment and procedures well. All questions were answered thoroughly reviewed. Debridement of mycotic and hypertrophic toenails, 1 through 5 bilateral and clearing of subungual debris. No ulceration, no infection noted.  Return Visit-Office Procedure: Patient instructed to return to the office for a follow up visit 3 months for continued evaluation and treatment.    Gardiner Barefoot DPM

## 2017-01-17 DIAGNOSIS — N184 Chronic kidney disease, stage 4 (severe): Secondary | ICD-10-CM | POA: Diagnosis not present

## 2017-01-17 DIAGNOSIS — H353222 Exudative age-related macular degeneration, left eye, with inactive choroidal neovascularization: Secondary | ICD-10-CM | POA: Diagnosis not present

## 2017-01-17 DIAGNOSIS — H02052 Trichiasis without entropian right lower eyelid: Secondary | ICD-10-CM | POA: Diagnosis not present

## 2017-01-17 DIAGNOSIS — I10 Essential (primary) hypertension: Secondary | ICD-10-CM | POA: Diagnosis not present

## 2017-01-17 DIAGNOSIS — R809 Proteinuria, unspecified: Secondary | ICD-10-CM | POA: Diagnosis not present

## 2017-01-23 DIAGNOSIS — N183 Chronic kidney disease, stage 3 (moderate): Secondary | ICD-10-CM | POA: Diagnosis not present

## 2017-01-23 DIAGNOSIS — R809 Proteinuria, unspecified: Secondary | ICD-10-CM | POA: Diagnosis not present

## 2017-01-23 DIAGNOSIS — N2581 Secondary hyperparathyroidism of renal origin: Secondary | ICD-10-CM | POA: Diagnosis not present

## 2017-01-23 DIAGNOSIS — I10 Essential (primary) hypertension: Secondary | ICD-10-CM | POA: Diagnosis not present

## 2017-02-28 DIAGNOSIS — H353222 Exudative age-related macular degeneration, left eye, with inactive choroidal neovascularization: Secondary | ICD-10-CM | POA: Diagnosis not present

## 2017-03-01 ENCOUNTER — Other Ambulatory Visit: Payer: Self-pay | Admitting: Family Medicine

## 2017-03-01 ENCOUNTER — Encounter: Payer: Self-pay | Admitting: Family Medicine

## 2017-03-01 DIAGNOSIS — H9190 Unspecified hearing loss, unspecified ear: Secondary | ICD-10-CM

## 2017-04-02 ENCOUNTER — Ambulatory Visit (INDEPENDENT_AMBULATORY_CARE_PROVIDER_SITE_OTHER): Payer: Medicare Other | Admitting: Cardiovascular Disease

## 2017-04-02 ENCOUNTER — Encounter: Payer: Self-pay | Admitting: Cardiovascular Disease

## 2017-04-02 VITALS — BP 120/62 | HR 83 | Ht 65.0 in | Wt 134.0 lb

## 2017-04-02 DIAGNOSIS — I482 Chronic atrial fibrillation, unspecified: Secondary | ICD-10-CM

## 2017-04-02 DIAGNOSIS — I10 Essential (primary) hypertension: Secondary | ICD-10-CM | POA: Diagnosis not present

## 2017-04-02 NOTE — Patient Instructions (Signed)
Medication Instructions: Continue same medications.   Labwork: Please make sure to ask them to draw CBC with other labs in August.   Procedures/Testing: None.   Follow-Up: 6 months with Dr. Fletcher Anon.   Any Additional Special Instructions Will Be Listed Below (If Applicable).     If you need a refill on your cardiac medications before your next appointment, please call your pharmacy.

## 2017-04-02 NOTE — Progress Notes (Signed)
Cardiology Office Note   Date:  04/02/2017   ID:  Lindsey, Cline 1919/04/10, MRN [id number]  PCP:  Lucille Passy, MD  Cardiologist:   Kathlyn Sacramento, MD   Chief Complaint  Patient presents with  . other    6 month follow up. Meds reviewed by the pt.'s med list from [facility]. "doing well."       History of Present Illness: Lindsey Cline is a 84 y.o. female who presents for a follow-up visit regarding chronic atrial fibrillation.  Echocardiogram in April 2016 showed normal LV systolic function, moderate mitral regurgitation, severely dilated left atrium and mildly dilated right atrium.  She has been treated with rate control with metoprolol and anticoagulation with low dose Eliquis given stage IV chronic kidney disease. She lives in [facility] facility.  She has been doing well overall with no chest pain, shortness of breath or palpitations. No falls. She denies any bleeding.  Past Medical History:  Diagnosis Date  . A-fib ([place])   . Heart murmur   . Hyperlipidemia   . Hypertension   . Kidney problem   . Macular degeneration   . Osteoporosis    osteoarthritis    Past Surgical History:  Procedure Laterality Date  . APPENDECTOMY  1942  . CATARACT EXTRACTION  1999  . INGUINAL HERNIA REPAIR  1946   Left  . TONSILLECTOMY  1928   ? Adenoids  . VAGINAL HYSTERECTOMY  1952     Current Outpatient Prescriptions  Medication Sig Dispense Refill  . apixaban (ELIQUIS) 2.5 MG TABS tablet Take 1 tablet (2.5 mg total) by mouth 2 (two) times daily. 60 tablet 6  . calcitRIOL (ROCALTROL) 0.25 MCG capsule Take 0.25 mcg by mouth daily.     . hydroxypropyl methylcellulose / hypromellose (ISOPTO TEARS / GONIOVISC) 2.5 % ophthalmic solution 1 drop 3 (three) times daily as needed for dry eyes.    Marland Kitchen lisinopril-hydrochlorothiazide (PRINZIDE,ZESTORETIC) 20-12.5 MG per tablet TAKE 1 TABLET BY MOUTH EVERY DAY 90 tablet 1  . metoprolol tartrate (LOPRESSOR) 25 MG  tablet Take 1 tablet (25 mg total) by mouth 2 (two) times daily. 60 tablet 3  . Probiotic Product (PROBIOTIC DAILY PO) Take by mouth daily.    . Vitamin D, Ergocalciferol, (DRISDOL) 50000 units CAPS capsule Take 50,000 Units by mouth every 7 (seven) days.      No current facility-administered medications for this visit.     Allergies:   Patient has no known allergies.    Social History:  The patient  reports that she has never smoked. She has never used smokeless tobacco. She reports that she drinks about 1.2 oz of alcohol per week . She reports that she does not use drugs.   Family History:  The patient's family history includes Cancer in her mother; Heart disease in her father.    ROS:  Please see the history of present illness.   Otherwise, review of systems are positive for none.   All other systems are reviewed and negative.    PHYSICAL EXAM: VS:  BP 120/62 (BP Location: Left Arm, Patient Position: Sitting, Cuff Size: Normal)   Pulse 83   Ht 5\' 5"  (1.651 m)   Wt 134 lb (60.8 kg)   BMI 22.30 kg/m  , BMI Body mass index is 22.3 kg/m. GEN: Well nourished, well developed, in no acute distress  HEENT: normal  Neck: no JVD, carotid bruits, or masses Cardiac: Irregularly irregular   ;  no murmurs, rubs, or gallops,no edema  Respiratory:  clear to auscultation bilaterally, normal work of breathing GI: soft, nontender, nondistended, + BS MS: no deformity or atrophy  Skin: warm and dry, no rash Neuro:  Strength and sensation are intact Psych: euthymic mood, full affect   EKG:  EKG is ordered today. The ekg ordered today demonstrates atrial fibrillation with Possible old anterior infarct. ST and T wave changes suggestive of lateral ischemia.   Recent Labs: 01/03/2017: ALT 15; BUN 30; Creatinine, Ser 1.40; Potassium 4.3; Sodium 140; TSH 1.74    Lipid Panel    Component Value Date/Time   CHOL 233 (H) 01/03/2017 1010   TRIG 68.0 01/03/2017 1010   HDL 64.60 01/03/2017 1010    CHOLHDL 4 01/03/2017 1010   VLDL 13.6 01/03/2017 1010   LDLCALC 155 (H) 01/03/2017 1010   LDLDIRECT 128.8 05/29/2008 1251      Wt Readings from Last 3 Encounters:  04/02/17 134 lb (60.8 kg)  01/08/17 137 lb 4 oz (62.3 kg)  01/03/17 137 lb 8 oz (62.4 kg)        ASSESSMENT AND PLAN:  1.  Chronic atrial fibrillation: Ventricular rate is well controlled on metoprolol. Continue long-term anticoagulation. She has no signs of bleeding.  I reviewed her labs in May which showed stable renal function. Hemoglobin was lower than baseline at 9.6. No signs of bleeding. Recommend repeat CBC and basic metabolic profile in few months.  2. Essential hypertension: Blood pressure is reasonably controlled on current medications.    Disposition:   FU with me in 6 months  Signed,  Kathlyn Sacramento, MD  04/02/2017 2:06 PM    [place]

## 2017-04-09 DIAGNOSIS — H353222 Exudative age-related macular degeneration, left eye, with inactive choroidal neovascularization: Secondary | ICD-10-CM | POA: Diagnosis not present

## 2017-04-12 ENCOUNTER — Ambulatory Visit: Payer: Medicare Other | Admitting: Podiatry

## 2017-05-15 DIAGNOSIS — N184 Chronic kidney disease, stage 4 (severe): Secondary | ICD-10-CM | POA: Diagnosis not present

## 2017-05-15 DIAGNOSIS — I10 Essential (primary) hypertension: Secondary | ICD-10-CM | POA: Diagnosis not present

## 2017-05-15 DIAGNOSIS — R809 Proteinuria, unspecified: Secondary | ICD-10-CM | POA: Diagnosis not present

## 2017-05-25 DIAGNOSIS — R809 Proteinuria, unspecified: Secondary | ICD-10-CM | POA: Diagnosis not present

## 2017-05-25 DIAGNOSIS — N183 Chronic kidney disease, stage 3 (moderate): Secondary | ICD-10-CM | POA: Diagnosis not present

## 2017-05-25 DIAGNOSIS — N2581 Secondary hyperparathyroidism of renal origin: Secondary | ICD-10-CM | POA: Diagnosis not present

## 2017-05-25 DIAGNOSIS — I10 Essential (primary) hypertension: Secondary | ICD-10-CM | POA: Diagnosis not present

## 2017-05-28 DIAGNOSIS — H353222 Exudative age-related macular degeneration, left eye, with inactive choroidal neovascularization: Secondary | ICD-10-CM | POA: Diagnosis not present

## 2017-07-04 DIAGNOSIS — G3184 Mild cognitive impairment, so stated: Secondary | ICD-10-CM | POA: Diagnosis not present

## 2017-07-04 DIAGNOSIS — E538 Deficiency of other specified B group vitamins: Secondary | ICD-10-CM | POA: Diagnosis not present

## 2017-07-26 DIAGNOSIS — H353131 Nonexudative age-related macular degeneration, bilateral, early dry stage: Secondary | ICD-10-CM | POA: Diagnosis not present

## 2017-09-21 DIAGNOSIS — I10 Essential (primary) hypertension: Secondary | ICD-10-CM | POA: Diagnosis not present

## 2017-09-21 DIAGNOSIS — N184 Chronic kidney disease, stage 4 (severe): Secondary | ICD-10-CM | POA: Diagnosis not present

## 2017-09-21 DIAGNOSIS — R809 Proteinuria, unspecified: Secondary | ICD-10-CM | POA: Diagnosis not present

## 2017-09-28 DIAGNOSIS — R809 Proteinuria, unspecified: Secondary | ICD-10-CM | POA: Diagnosis not present

## 2017-09-28 DIAGNOSIS — I10 Essential (primary) hypertension: Secondary | ICD-10-CM | POA: Diagnosis not present

## 2017-09-28 DIAGNOSIS — N183 Chronic kidney disease, stage 3 (moderate): Secondary | ICD-10-CM | POA: Diagnosis not present

## 2017-09-28 DIAGNOSIS — N2581 Secondary hyperparathyroidism of renal origin: Secondary | ICD-10-CM | POA: Diagnosis not present

## 2017-10-04 ENCOUNTER — Ambulatory Visit: Payer: Medicare Other | Admitting: Cardiovascular Disease

## 2017-10-12 ENCOUNTER — Ambulatory Visit: Payer: Medicare Other | Admitting: Internal Medicine

## 2017-10-12 DIAGNOSIS — I482 Chronic atrial fibrillation, unspecified: Secondary | ICD-10-CM

## 2017-10-12 DIAGNOSIS — E7800 Pure hypercholesterolemia, unspecified: Secondary | ICD-10-CM

## 2017-10-12 DIAGNOSIS — M159 Polyosteoarthritis, unspecified: Secondary | ICD-10-CM | POA: Diagnosis not present

## 2017-10-12 DIAGNOSIS — I10 Essential (primary) hypertension: Secondary | ICD-10-CM

## 2017-10-12 DIAGNOSIS — R413 Other amnesia: Secondary | ICD-10-CM | POA: Diagnosis not present

## 2017-10-12 DIAGNOSIS — N183 Chronic kidney disease, stage 3 unspecified: Secondary | ICD-10-CM

## 2017-10-19 ENCOUNTER — Encounter: Payer: Self-pay | Admitting: Internal Medicine

## 2017-10-19 NOTE — Assessment & Plan Note (Signed)
No issues off meds °Will monitor °

## 2017-10-19 NOTE — Assessment & Plan Note (Signed)
Controlled on Lisinopril HCT and Metoprolol Will monitor 

## 2017-10-19 NOTE — Assessment & Plan Note (Signed)
Currently not on any medications Will monitor

## 2017-10-19 NOTE — Assessment & Plan Note (Signed)
No c/o pain Continue Tylenol prn

## 2017-10-19 NOTE — Progress Notes (Signed)
Subjective:    Patient ID: Lindsey Cline, female    DOB: 01-23-1919, 84 y.o.   MRN: [id number]  HPI  Asked to see resident in ALF at [facility]. Requesting to switch from Dr. Deborra Medina to Dr. Silvio Pate. Has been in ALF for 2 years. She feels like she is a restless sleeper but does not nap during the day.  Independent with ADL's. She has had 1 fall over the last 2 years, no injury. She has bilateral hearing aids. Appetite is fair, she does not like the food. She denies issues with incontinence. Her bowels move okay. She denies chest pain, reflux or SOB.  MCI: She follows with neurology. She is not currently taking any medications to augment her memory.  Afib: Controlled on Metoprolol and Eliquis. She follows with Dr. Fletcher Anon.  HLD: She is not currently taking any cholesterol lowering medication. She reports she does not eat fried foods.   HTN: Her BP today is 112/60. She is taking Lisinopril HCT and Metoprolol as prescribed.  Osteoarthritis: She c/o pain mostly in her hands and knees. She takes Tylenol as needed with good relief.  CKD 3: Baseline creat 1.4. She follows with nephrology.  Review of Systems      Past Medical History:  Diagnosis Date  . A-fib ([place])   . Heart murmur   . Hyperlipidemia   . Hypertension   . Kidney problem   . Macular degeneration   . Osteoporosis    osteoarthritis    Current Outpatient Medications  Medication Sig Dispense Refill  . apixaban (ELIQUIS) 2.5 MG TABS tablet Take 1 tablet (2.5 mg total) by mouth 2 (two) times daily. 60 tablet 6  . calcitRIOL (ROCALTROL) 0.25 MCG capsule Take 0.25 mcg by mouth daily.     . hydroxypropyl methylcellulose / hypromellose (ISOPTO TEARS / GONIOVISC) 2.5 % ophthalmic solution 1 drop 3 (three) times daily as needed for dry eyes.    Marland Kitchen lisinopril-hydrochlorothiazide (PRINZIDE,ZESTORETIC) 20-12.5 MG per tablet TAKE 1 TABLET BY MOUTH EVERY DAY 90 tablet 1  . metoprolol tartrate (LOPRESSOR) 25 MG tablet Take 1 tablet (25 mg  total) by mouth 2 (two) times daily. 60 tablet 3  . Probiotic Product (PROBIOTIC DAILY PO) Take by mouth daily.    . Vitamin D, Ergocalciferol, (DRISDOL) 50000 units CAPS capsule Take 50,000 Units by mouth every 7 (seven) days.      No current facility-administered medications for this visit.     No Known Allergies  Family History  Problem Relation Age of Onset  . Cancer Mother        colon  . Heart disease Father     Social History   Socioeconomic History  . Marital status: Widowed    Spouse name: Not on file  . Number of children: 1  . Years of education: Not on file  . Highest education level: Not on file  Social Needs  . Financial resource strain: Not on file  . Food insecurity - worry: Not on file  . Food insecurity - inability: Not on file  . Transportation needs - medical: Not on file  . Transportation needs - non-medical: Not on file  Occupational History  . Occupation: Housewife  Tobacco Use  . Smoking status: Never Smoker  . Smokeless tobacco: Never Used  Substance and Sexual Activity  . Alcohol use: Yes    Alcohol/week: 1.2 oz    Types: 2 Glasses of wine per week    Comment: WINE 5 DAYS PER WEEK  .  Drug use: No  . Sexual activity: No  Other Topics Concern  . Not on file  Social History Narrative   Has a living will   Son HPOA   DNR     Constitutional: Denies fever, malaise, fatigue, headache or abrupt weight changes.  HEENT: Denies eye pain, eye redness, ear pain, ringing in the ears, wax buildup, runny nose, nasal congestion, bloody nose, or sore throat. Respiratory: Denies difficulty breathing, shortness of breath, cough or sputum production.   Cardiovascular: Denies chest pain, chest tightness, palpitations or swelling in the hands or feet.  Gastrointestinal: Denies abdominal pain, bloating, constipation, diarrhea or blood in the stool.  GU: Denies urgency, frequency, pain with urination, burning sensation, blood in urine, odor or  discharge. Musculoskeletal: Pt reports joint pain in hands and knees. Denies decrease in range of motion, difficulty with gait, muscle pain.  Skin: Denies redness, rashes, lesions or ulcercations.  Neurological: Pt reports slight difficulty with short term memory. Denies dizziness, difficulty with speech or problems with coordination.  Psych: Denies anxiety, depression, SI/HI.  No other specific complaints in a complete review of systems (except as listed in HPI above).  Objective:   Physical Exam    BP 112/60   Pulse 80   Temp 98.5 F (36.9 C)   Resp 16   Wt 133 lb (60.3 kg)   BMI 22.13 kg/m  Wt Readings from Last 3 Encounters:  10/19/17 133 lb (60.3 kg)  04/02/17 134 lb (60.8 kg)  01/08/17 137 lb 4 oz (62.3 kg)    General: Appears her stated age, in NAD. Neck:  No adenopathy or JVD. Cardiovascular: Normal rate with irregular rhythm. No murmur noted. Pulmonary/Chest: Normal effort and positive vesicular breath sounds. No respiratory distress. No wheezes, rales or ronchi noted.  Abdomen: Soft and nontender.  Musculoskeletal: Gait slow and steady with use of walker.  Neurological: Alert and oriented. Engages appropriately.  Psychiatric: Mood and affect normal. Behavior is normal. Judgment and thought content normal.    BMET    Component Value Date/Time   NA 140 01/03/2017 1010   NA 143 05/14/2012 0546   K 4.3 01/03/2017 1010   K 4.2 05/14/2012 0546   CL 103 01/03/2017 1010   CL 111 (H) 05/14/2012 0546   CO2 30 01/03/2017 1010   CO2 25 05/14/2012 0546   GLUCOSE 108 (H) 01/03/2017 1010   GLUCOSE 92 05/14/2012 0546   BUN 30 (H) 01/03/2017 1010   BUN 39 (H) 05/14/2012 0546   CREATININE 1.40 (H) 01/03/2017 1010   CREATININE 1.26 05/14/2012 0546   CALCIUM 9.0 01/03/2017 1010   CALCIUM 8.0 (L) 05/14/2012 0546   GFRNONAA 37 (L) 05/14/2012 0546   GFRAA 43 (L) 05/14/2012 0546    Lipid Panel     Component Value Date/Time   CHOL 233 (H) 01/03/2017 1010   TRIG 68.0  01/03/2017 1010   HDL 64.60 01/03/2017 1010   CHOLHDL 4 01/03/2017 1010   VLDL 13.6 01/03/2017 1010   LDLCALC 155 (H) 01/03/2017 1010    CBC    Component Value Date/Time   WBC 6.0 03/22/2015 1026   RBC 3.75 (L) 03/22/2015 1026   HGB 12.2 03/22/2015 1026   HGB 8.4 (L) 05/16/2012 0429   HCT 37.3 03/22/2015 1026   HCT 24.2 (L) 05/16/2012 0429   PLT 254.0 03/22/2015 1026   PLT 143 (L) 05/16/2012 0429   MCV 99.6 03/22/2015 1026   MCV 99 05/16/2012 0429   MCH 34.4 (H) 05/16/2012 0429  MCHC 32.7 03/22/2015 1026   RDW 14.7 03/22/2015 1026   RDW 15.0 (H) 05/16/2012 0429   LYMPHSABS 1.5 03/22/2015 1026   LYMPHSABS 1.7 05/16/2012 0429   MONOABS 0.5 03/22/2015 1026   MONOABS 0.5 05/16/2012 0429   EOSABS 0.1 03/22/2015 1026   EOSABS 0.2 05/16/2012 0429   BASOSABS 0.0 03/22/2015 1026   BASOSABS 0.1 05/16/2012 0429    Hgb A1C No results found for: HGBA1C        Assessment & Plan:

## 2017-10-19 NOTE — Assessment & Plan Note (Signed)
Monitor yearly BMET On ACEI

## 2017-10-19 NOTE — Assessment & Plan Note (Signed)
Controlled on Metoprolol and Eliquis She will continue to follow with Dr. Fletcher Anon

## 2017-11-21 ENCOUNTER — Ambulatory Visit: Payer: Medicare Other | Admitting: Internal Medicine

## 2017-11-21 VITALS — BP 122/72 | HR 71

## 2017-11-21 DIAGNOSIS — S90511A Abrasion, right ankle, initial encounter: Secondary | ICD-10-CM | POA: Diagnosis not present

## 2017-11-23 ENCOUNTER — Encounter: Payer: Self-pay | Admitting: Cardiovascular Disease

## 2017-11-23 ENCOUNTER — Ambulatory Visit (INDEPENDENT_AMBULATORY_CARE_PROVIDER_SITE_OTHER): Payer: Medicare Other | Admitting: Cardiovascular Disease

## 2017-11-23 VITALS — BP 128/78 | HR 95 | Ht 65.0 in | Wt 135.0 lb

## 2017-11-23 DIAGNOSIS — I482 Chronic atrial fibrillation, unspecified: Secondary | ICD-10-CM

## 2017-11-23 DIAGNOSIS — I10 Essential (primary) hypertension: Secondary | ICD-10-CM | POA: Diagnosis not present

## 2017-11-23 NOTE — Patient Instructions (Signed)
Medication Instructions: Continue same medications.   Labwork: None.   Procedures/Testing: None.   Follow-Up: 6 months with Dr. Arida.   Any Additional Special Instructions Will Be Listed Below (If Applicable).     If you need a refill on your cardiac medications before your next appointment, please call your pharmacy.   

## 2017-11-23 NOTE — Progress Notes (Signed)
Cardiology Office Note   Date:  11/23/2017   ID:  ADELISA SATTERWHITE, DOB January 25, 1919, MRN [id number]  PCP:  Lucille Passy, MD  Cardiologist:   Kathlyn Sacramento, MD   Chief Complaint  Patient presents with  . Other    6 month f/u no complaints today. Meds reviewed verbally with pt.      History of Present Illness: Lindsey Cline is a 84 y.o. female who presents for a follow-up visit regarding chronic atrial fibrillation.  Echocardiogram in April 2016 showed normal LV systolic function, moderate mitral regurgitation, severely dilated left atrium and mildly dilated right atrium.  She has been treated with rate control with metoprolol and anticoagulation with low dose Eliquis . She lives in [facility].  She has been doing well overall with no chest pain, shortness of breath or palpitations. No falls. She denies any bleeding.  Past Medical History:  Diagnosis Date  . A-fib ([place])   . Heart murmur   . Hyperlipidemia   . Hypertension   . Kidney problem   . Macular degeneration   . Osteoporosis    osteoarthritis    Past Surgical History:  Procedure Laterality Date  . APPENDECTOMY  1942  . CATARACT EXTRACTION  1999  . INGUINAL HERNIA REPAIR  1946   Left  . TONSILLECTOMY  1928   ? Adenoids  . VAGINAL HYSTERECTOMY  1952     Current Outpatient Medications  Medication Sig Dispense Refill  . apixaban (ELIQUIS) 2.5 MG TABS tablet Take 1 tablet (2.5 mg total) by mouth 2 (two) times daily. 60 tablet 6  . calcitRIOL (ROCALTROL) 0.25 MCG capsule Take 0.25 mcg by mouth daily.     . hydroxypropyl methylcellulose / hypromellose (ISOPTO TEARS / GONIOVISC) 2.5 % ophthalmic solution 1 drop 3 (three) times daily as needed for dry eyes.    Marland Kitchen lisinopril-hydrochlorothiazide (PRINZIDE,ZESTORETIC) 20-12.5 MG per tablet TAKE 1 TABLET BY MOUTH EVERY DAY 90 tablet 1  . metoprolol tartrate (LOPRESSOR) 25 MG tablet Take 1 tablet (25 mg total) by mouth 2 (two) times daily. 60  tablet 3  . Probiotic Product (PROBIOTIC DAILY PO) Take by mouth daily.    . Vitamin D, Ergocalciferol, (DRISDOL) 50000 units CAPS capsule Take 50,000 Units by mouth every 7 (seven) days.      No current facility-administered medications for this visit.     Allergies:   Patient has no known allergies.    Social History:  The patient  reports that  has never smoked. she has never used smokeless tobacco. She reports that she drinks about 1.2 oz of alcohol per week. She reports that she does not use drugs.   Family History:  The patient's family history includes Cancer in her mother; Heart disease in her father.    ROS:  Please see the history of present illness.   Otherwise, review of systems are positive for none.   All other systems are reviewed and negative.    PHYSICAL EXAM: VS:  BP 128/78 (BP Location: Left Arm, Patient Position: Sitting, Cuff Size: Normal)   Pulse 95   Ht 5\' 5"  (1.651 m)   Wt 135 lb (61.2 kg)   BMI 22.47 kg/m  , BMI Body mass index is 22.47 kg/m. GEN: Well nourished, well developed, in no acute distress  HEENT: normal  Neck: no JVD, carotid bruits, or masses Cardiac: Irregularly irregular   ; no murmurs, rubs, or gallops,no edema  Respiratory:  clear to auscultation  bilaterally, normal work of breathing GI: soft, nontender, nondistended, + BS MS: no deformity or atrophy  Skin: warm and dry, no rash Neuro:  Strength and sensation are intact Psych: euthymic mood, full affect   EKG:  EKG is ordered today. The ekg ordered today demonstrates atrial fibrillation with low voltage and nonspecific ST and T wave changes.  Recent Labs: 01/03/2017: ALT 15; BUN 30; Creatinine, Ser 1.40; Potassium 4.3; Sodium 140; TSH 1.74    Lipid Panel    Component Value Date/Time   CHOL 233 (H) 01/03/2017 1010   TRIG 68.0 01/03/2017 1010   HDL 64.60 01/03/2017 1010   CHOLHDL 4 01/03/2017 1010   VLDL 13.6 01/03/2017 1010   LDLCALC 155 (H) 01/03/2017 1010   LDLDIRECT 128.8  05/29/2008 1251      Wt Readings from Last 3 Encounters:  11/23/17 135 lb (61.2 kg)  10/19/17 133 lb (60.3 kg)  04/02/17 134 lb (60.8 kg)        ASSESSMENT AND PLAN:  1.  Chronic atrial fibrillation: Ventricular rate is well controlled on metoprolol. Continue long-term anticoagulation.  I reviewed most recent labs done in December which showed a hemoglobin of 11.7, creatinine was 1.36 and BUN of 20.  I decided to keep Eliquis at the low dose given her age, weight close to 60 kg low GFR.  I think it would be misleading to go by creatinine level alone at her age to determine Eliquis dose.  2. Essential hypertension: Blood pressure is reasonably controlled on current medications.    Disposition:   FU with me in 6 months  Signed,  Kathlyn Sacramento, MD  11/23/2017 2:21 PM    [facility]

## 2017-12-14 ENCOUNTER — Encounter: Payer: Self-pay | Admitting: Internal Medicine

## 2017-12-14 DIAGNOSIS — H353131 Nonexudative age-related macular degeneration, bilateral, early dry stage: Secondary | ICD-10-CM | POA: Diagnosis not present

## 2017-12-14 NOTE — Patient Instructions (Signed)
Abrasion An abrasion is a cut or scrape on the surface of your skin. An abrasion does not go through all of the layers of your skin. It is important to take good care of your abrasion to prevent infection. Follow these instructions at home: Medicines  Take or apply medicines only as told by your doctor.  If you were prescribed an antibiotic ointment, finish all of it even if you start to feel better. Wound care  Clean the wound with mild soap and water 2-3 times per day or as told by your doctor. Pat your wound dry with a clean towel. Do not rub it.  There are many ways to close and cover a wound. Follow instructions from your doctor about: ? How to take care of your wound. ? When and how you should change your bandage (dressing). ? When and how you should take off your dressing.  Check your wound every day for signs of infection. Watch for: ? Redness, swelling, or pain. ? Fluid, blood, or pus. General instructions  Keep the dressing dry as told by your doctor. Do not take baths, swim, use a hot tub, or do anything that would put your wound underwater until your doctor says it is okay.  If there is swelling, raise (elevate) the injured area above the level of your heart while you are sitting or lying down.  Keep all follow-up visits as told by your doctor. This is important. Contact a doctor if:  You were given a tetanus shot and you have any of these where the needle went in: ? Swelling. ? Very bad pain. ? Redness. ? Bleeding.  Medicine does not help your pain.  You have any of these at the site of the wound: ? More redness. ? More swelling. ? More pain. Get help right away if:  You have a red streak going away from your wound.  You have a fever.  You have fluid, blood, or pus coming from your wound.  There is a bad smell coming from your wound. This information is not intended to replace advice given to you by your health care provider. Make sure you discuss any  questions you have with your health care provider. Document Released: 03/13/2008 Document Revised: 03/02/2016 Document Reviewed: 09/23/2014 Elsevier Interactive Patient Education  [employer].

## 2017-12-14 NOTE — Progress Notes (Signed)
Subjective:    Patient ID: Lindsey Cline, female    DOB: 22-Mar-1919, 84 y.o.   MRN: [id number]  HPI  Asked to see resident in [address] RN concerned about redness over ankle. Started 3 days ago. Resident reports pain with ambulation. No noted injury. Resident has been elevating foot without much improvement.   Review of Systems      Past Medical History:  Diagnosis Date  . A-fib ([place])   . Heart murmur   . Hyperlipidemia   . Hypertension   . Kidney problem   . Macular degeneration   . Osteoporosis    osteoarthritis    Current Outpatient Medications  Medication Sig Dispense Refill  . apixaban (ELIQUIS) 2.5 MG TABS tablet Take 1 tablet (2.5 mg total) by mouth 2 (two) times daily. 60 tablet 6  . calcitRIOL (ROCALTROL) 0.25 MCG capsule Take 0.25 mcg by mouth daily.     . hydroxypropyl methylcellulose / hypromellose (ISOPTO TEARS / GONIOVISC) 2.5 % ophthalmic solution 1 drop 3 (three) times daily as needed for dry eyes.    Marland Kitchen lisinopril-hydrochlorothiazide (PRINZIDE,ZESTORETIC) 20-12.5 MG per tablet TAKE 1 TABLET BY MOUTH EVERY DAY 90 tablet 1  . metoprolol tartrate (LOPRESSOR) 25 MG tablet Take 1 tablet (25 mg total) by mouth 2 (two) times daily. 60 tablet 3  . Probiotic Product (PROBIOTIC DAILY PO) Take by mouth daily.    . Vitamin D, Ergocalciferol, (DRISDOL) 50000 units CAPS capsule Take 50,000 Units by mouth every 7 (seven) days.      No current facility-administered medications for this visit.     No Known Allergies  Family History  Problem Relation Age of Onset  . Cancer Mother        colon  . Heart disease Father     Social History   Socioeconomic History  . Marital status: Widowed    Spouse name: Not on file  . Number of children: 1  . Years of education: Not on file  . Highest education level: Not on file  Social Needs  . Financial resource strain: Not on file  . Food insecurity - worry: Not on file  . Food insecurity - inability: Not on file  .  Transportation needs - medical: Not on file  . Transportation needs - non-medical: Not on file  Occupational History  . Occupation: Housewife  Tobacco Use  . Smoking status: Never Smoker  . Smokeless tobacco: Never Used  Substance and Sexual Activity  . Alcohol use: Yes    Alcohol/week: 1.2 oz    Types: 2 Glasses of wine per week    Comment: WINE 5 DAYS PER WEEK  . Drug use: No  . Sexual activity: No  Other Topics Concern  . Not on file  Social History Narrative   Has a living will   Son HPOA   DNR     Constitutional: Denies fever, malaise, fatigue, headache or abrupt weight changes.  Musculoskeletal: Pt reports right ankle pain. Denies decrease in range of motion, difficulty with gait, muscle pain or joint swelling.  Skin: Pt reports right ankle redness. Denies rashes, lesions or ulcercations.    No other specific complaints in a complete review of systems (except as listed in HPI above).  Objective:   Physical Exam  BP 122/72   Pulse 71    Wt Readings from Last 3 Encounters:  11/23/17 135 lb (61.2 kg)  10/19/17 133 lb (60.3 kg)  04/02/17 134 lb (60.8 kg)    General:  Appears her stated age, in NAD. Skin: Small scab noted over lateral malleolus, no redness or warmth noted. Vascular changes noted of BLE. Musculoskeletal: Normal flexion, extension and rotation of the right ankle. Swelling noted over the lateral melleolus. Pedal pulse 1+ bilaterally.  BMET    Component Value Date/Time   NA 140 01/03/2017 1010   NA 143 05/14/2012 0546   K 4.3 01/03/2017 1010   K 4.2 05/14/2012 0546   CL 103 01/03/2017 1010   CL 111 (H) 05/14/2012 0546   CO2 30 01/03/2017 1010   CO2 25 05/14/2012 0546   GLUCOSE 108 (H) 01/03/2017 1010   GLUCOSE 92 05/14/2012 0546   BUN 30 (H) 01/03/2017 1010   BUN 39 (H) 05/14/2012 0546   CREATININE 1.40 (H) 01/03/2017 1010   CREATININE 1.26 05/14/2012 0546   CALCIUM 9.0 01/03/2017 1010   CALCIUM 8.0 (L) 05/14/2012 0546   GFRNONAA 37 (L)  05/14/2012 0546   GFRAA 43 (L) 05/14/2012 0546    Lipid Panel     Component Value Date/Time   CHOL 233 (H) 01/03/2017 1010   TRIG 68.0 01/03/2017 1010   HDL 64.60 01/03/2017 1010   CHOLHDL 4 01/03/2017 1010   VLDL 13.6 01/03/2017 1010   LDLCALC 155 (H) 01/03/2017 1010    CBC    Component Value Date/Time   WBC 6.0 03/22/2015 1026   RBC 3.75 (L) 03/22/2015 1026   HGB 12.2 03/22/2015 1026   HGB 8.4 (L) 05/16/2012 0429   HCT 37.3 03/22/2015 1026   HCT 24.2 (L) 05/16/2012 0429   PLT 254.0 03/22/2015 1026   PLT 143 (L) 05/16/2012 0429   MCV 99.6 03/22/2015 1026   MCV 99 05/16/2012 0429   MCH 34.4 (H) 05/16/2012 0429   MCHC 32.7 03/22/2015 1026   RDW 14.7 03/22/2015 1026   RDW 15.0 (H) 05/16/2012 0429   LYMPHSABS 1.5 03/22/2015 1026   LYMPHSABS 1.7 05/16/2012 0429   MONOABS 0.5 03/22/2015 1026   MONOABS 0.5 05/16/2012 0429   EOSABS 0.1 03/22/2015 1026   EOSABS 0.2 05/16/2012 0429   BASOSABS 0.0 03/22/2015 1026   BASOSABS 0.1 05/16/2012 0429    Hgb A1C No results found for: HGBA1C          Assessment & Plan:   Abrasion to Right Ankle:  No concern for infection Can use TAB and cover with band aid Continue elevation. Monitor and notify me if any changes  Will reassess as needed Webb Silversmith, NP

## 2018-01-02 ENCOUNTER — Other Ambulatory Visit: Payer: Medicare Other

## 2018-01-03 DIAGNOSIS — I10 Essential (primary) hypertension: Secondary | ICD-10-CM | POA: Diagnosis not present

## 2018-01-03 DIAGNOSIS — R809 Proteinuria, unspecified: Secondary | ICD-10-CM | POA: Diagnosis not present

## 2018-01-03 DIAGNOSIS — N184 Chronic kidney disease, stage 4 (severe): Secondary | ICD-10-CM | POA: Diagnosis not present

## 2018-01-07 DIAGNOSIS — G3184 Mild cognitive impairment, so stated: Secondary | ICD-10-CM | POA: Diagnosis not present

## 2018-01-07 DIAGNOSIS — E538 Deficiency of other specified B group vitamins: Secondary | ICD-10-CM | POA: Diagnosis not present

## 2018-01-08 DIAGNOSIS — R809 Proteinuria, unspecified: Secondary | ICD-10-CM | POA: Diagnosis not present

## 2018-01-08 DIAGNOSIS — I10 Essential (primary) hypertension: Secondary | ICD-10-CM | POA: Diagnosis not present

## 2018-01-08 DIAGNOSIS — N183 Chronic kidney disease, stage 3 (moderate): Secondary | ICD-10-CM | POA: Diagnosis not present

## 2018-01-08 DIAGNOSIS — N2581 Secondary hyperparathyroidism of renal origin: Secondary | ICD-10-CM | POA: Diagnosis not present

## 2018-01-09 ENCOUNTER — Encounter: Payer: Medicare Other | Admitting: Family Medicine

## 2018-01-10 ENCOUNTER — Ambulatory Visit: Payer: Medicare Other | Admitting: Internal Medicine

## 2018-01-10 ENCOUNTER — Encounter: Payer: Self-pay | Admitting: Internal Medicine

## 2018-01-10 VITALS — BP 94/56 | HR 72 | Temp 97.8°F | Resp 16 | Wt 134.0 lb

## 2018-01-10 DIAGNOSIS — L97519 Non-pressure chronic ulcer of other part of right foot with unspecified severity: Secondary | ICD-10-CM

## 2018-01-10 DIAGNOSIS — I10 Essential (primary) hypertension: Secondary | ICD-10-CM

## 2018-01-10 DIAGNOSIS — N183 Chronic kidney disease, stage 3 unspecified: Secondary | ICD-10-CM

## 2018-01-10 DIAGNOSIS — R4189 Other symptoms and signs involving cognitive functions and awareness: Secondary | ICD-10-CM

## 2018-01-10 DIAGNOSIS — I482 Chronic atrial fibrillation, unspecified: Secondary | ICD-10-CM

## 2018-01-10 DIAGNOSIS — L97509 Non-pressure chronic ulcer of other part of unspecified foot with unspecified severity: Secondary | ICD-10-CM | POA: Insufficient documentation

## 2018-01-10 NOTE — Assessment & Plan Note (Signed)
BP Readings from Last 3 Encounters:  01/10/18 (!) 94/56  11/23/17 128/78  12/14/17 122/72   Good control Low today but no symptoms ARB with the CKD

## 2018-01-10 NOTE — Assessment & Plan Note (Signed)
Good rate control Is on the eliquis

## 2018-01-10 NOTE — Assessment & Plan Note (Signed)
Scabbed lesion on right lateral malleolus Could be vascular but stable and not inflamed At her age, will not take action unless worsens

## 2018-01-10 NOTE — Progress Notes (Signed)
Subjective:    Patient ID: Lindsey Cline, female    DOB: 20-Jan-1919, 84 y.o.   MRN: [id number]  HPI [place] in [place] apartment for follow up of chronic health conditions Reviewed status with Laurence Aly Son sent email with some concerns I will address  She is satisfied with things Continues with cardiologist Doesn't feel palpitations Walks with rollator and stable respiratory status No chest pain Mild chronic swelling in feet. Persistent scaly area on right lateral malleolus----no pain No dizziness or syncope (but takes it slow when she gets up)  Independent with ADLs Generally continent---wears pullups at times (if going out) Mild memory issues have not progressed--reviewed neurology note  No sig arthritis pain or problems  Known CKD Recent labs are stable Mild proteinuria Does see nephrologist  Current Outpatient Medications on File Prior to Visit  Medication Sig Dispense Refill  . apixaban (ELIQUIS) 2.5 MG TABS tablet Take 1 tablet (2.5 mg total) by mouth 2 (two) times daily. 60 tablet 6  . calcitRIOL (ROCALTROL) 0.25 MCG capsule Take 0.25 mcg by mouth daily.     . hydroxypropyl methylcellulose / hypromellose (ISOPTO TEARS / GONIOVISC) 2.5 % ophthalmic solution 1 drop 3 (three) times daily as needed for dry eyes.    Marland Kitchen lisinopril-hydrochlorothiazide (PRINZIDE,ZESTORETIC) 20-12.5 MG per tablet TAKE 1 TABLET BY MOUTH EVERY DAY 90 tablet 1  . metoprolol tartrate (LOPRESSOR) 25 MG tablet Take 1 tablet (25 mg total) by mouth 2 (two) times daily. 60 tablet 3  . Probiotic Product (PROBIOTIC DAILY PO) Take by mouth daily.    . Vitamin D, Ergocalciferol, (DRISDOL) 50000 units CAPS capsule Take 50,000 Units by mouth every 7 (seven) days.      No current facility-administered medications on file prior to visit.     No Known Allergies  Past Medical History:  Diagnosis Date  . A-fib ([place])   . Heart murmur   . Hyperlipidemia   . Hypertension   . Kidney problem   . Macular  degeneration   . Osteoporosis    osteoarthritis    Past Surgical History:  Procedure Laterality Date  . APPENDECTOMY  1942  . CATARACT EXTRACTION  1999  . INGUINAL HERNIA REPAIR  1946   Left  . TONSILLECTOMY  1928   ? Adenoids  . VAGINAL HYSTERECTOMY  1952    Family History  Problem Relation Age of Onset  . Cancer Mother        colon  . Heart disease Father     Social History   Socioeconomic History  . Marital status: Widowed    Spouse name: Not on file  . Number of children: 1  . Years of education: Not on file  . Highest education level: Not on file  Occupational History  . Occupation: Housewife  Social Needs  . Financial resource strain: Not on file  . Food insecurity:    Worry: Not on file    Inability: Not on file  . Transportation needs:    Medical: Not on file    Non-medical: Not on file  Tobacco Use  . Smoking status: Never Smoker  . Smokeless tobacco: Never Used  Substance and Sexual Activity  . Alcohol use: Yes    Alcohol/week: 1.2 oz    Types: 2 Glasses of wine per week    Comment: WINE 5 DAYS PER WEEK  . Drug use: No  . Sexual activity: Never  Lifestyle  . Physical activity:    Days per week: Not on  file    Minutes per session: Not on file  . Stress: Not on file  Relationships  . Social connections:    Talks on phone: Not on file    Gets together: Not on file    Attends religious service: Not on file    Active member of club or organization: Not on file    Attends meetings of clubs or organizations: Not on file    Relationship status: Not on file  . Intimate partner violence:    Fear of current or ex partner: Not on file    Emotionally abused: Not on file    Physically abused: Not on file    Forced sexual activity: Not on file  Other Topics Concern  . Not on file  Social History Narrative   Has a living will   Son HPOA   DNR   Review of Systems Did have one fall bringing her laundry basket (door hit her)---spent some time in  health care Light sleeper but sleeps okay Appetite is small--but does okay Weight stable Mild nasal drip--she prefers no medication Mild redness in face--she is not concerned    Objective:   Physical Exam  Constitutional: She appears well-developed. No distress.  Neck: No thyromegaly present.  Cardiovascular: Normal rate. Exam reveals no gallop.  No murmur heard. Faint pedal pulses irregular  Pulmonary/Chest: Effort normal and breath sounds normal. No respiratory distress. She has no wheezes. She has no rales.  Abdominal: Soft. There is no tenderness.  Musculoskeletal:  Trace to 1+ foot/ankle edema  Lymphadenopathy:    She has no cervical adenopathy.  Skin:  Slight benign hyperpigmented areas on face Scabbed lesion right ankle (not inflamed)----dry skin on feet and legs  Psychiatric: She has a normal mood and affect.          Assessment & Plan:

## 2018-01-10 NOTE — Assessment & Plan Note (Signed)
Just MCI Functions fine

## 2018-01-10 NOTE — Assessment & Plan Note (Signed)
3b but stable Sees nephrologist and continues on lisinopril

## 2018-02-18 ENCOUNTER — Encounter: Payer: Self-pay | Admitting: Internal Medicine

## 2018-02-18 DIAGNOSIS — Z0000 Encounter for general adult medical examination without abnormal findings: Secondary | ICD-10-CM | POA: Diagnosis not present

## 2018-02-18 DIAGNOSIS — I10 Essential (primary) hypertension: Secondary | ICD-10-CM | POA: Diagnosis not present

## 2018-02-18 DIAGNOSIS — J301 Allergic rhinitis due to pollen: Secondary | ICD-10-CM | POA: Diagnosis not present

## 2018-02-18 DIAGNOSIS — H903 Sensorineural hearing loss, bilateral: Secondary | ICD-10-CM | POA: Diagnosis not present

## 2018-02-18 DIAGNOSIS — N189 Chronic kidney disease, unspecified: Secondary | ICD-10-CM | POA: Diagnosis not present

## 2018-04-29 DIAGNOSIS — N2581 Secondary hyperparathyroidism of renal origin: Secondary | ICD-10-CM | POA: Diagnosis not present

## 2018-04-29 DIAGNOSIS — I10 Essential (primary) hypertension: Secondary | ICD-10-CM | POA: Diagnosis not present

## 2018-04-29 DIAGNOSIS — N183 Chronic kidney disease, stage 3 (moderate): Secondary | ICD-10-CM | POA: Diagnosis not present

## 2018-04-29 DIAGNOSIS — R809 Proteinuria, unspecified: Secondary | ICD-10-CM | POA: Diagnosis not present

## 2018-05-08 ENCOUNTER — Ambulatory Visit: Payer: Medicare Other | Admitting: Internal Medicine

## 2018-05-08 DIAGNOSIS — M810 Age-related osteoporosis without current pathological fracture: Secondary | ICD-10-CM | POA: Diagnosis not present

## 2018-05-08 DIAGNOSIS — I10 Essential (primary) hypertension: Secondary | ICD-10-CM

## 2018-05-08 DIAGNOSIS — I482 Chronic atrial fibrillation, unspecified: Secondary | ICD-10-CM

## 2018-05-08 DIAGNOSIS — E7800 Pure hypercholesterolemia, unspecified: Secondary | ICD-10-CM | POA: Diagnosis not present

## 2018-05-08 DIAGNOSIS — R413 Other amnesia: Secondary | ICD-10-CM

## 2018-05-08 DIAGNOSIS — M159 Polyosteoarthritis, unspecified: Secondary | ICD-10-CM

## 2018-05-15 ENCOUNTER — Encounter: Payer: Self-pay | Admitting: Internal Medicine

## 2018-05-15 DIAGNOSIS — M810 Age-related osteoporosis without current pathological fracture: Secondary | ICD-10-CM | POA: Insufficient documentation

## 2018-05-15 NOTE — Assessment & Plan Note (Signed)
Will schedule Tylenol Arthritis 3 x day

## 2018-05-15 NOTE — Patient Instructions (Signed)
Fat and Cholesterol Restricted Diet Getting too much fat and cholesterol in your diet may cause health problems. Following this diet helps keep your fat and cholesterol at normal levels. This can keep you from getting sick. What types of fat should I choose?  Choose monosaturated and polyunsaturated fats. These are found in foods such as olive oil, canola oil, flaxseeds, walnuts, almonds, and seeds.  Eat more omega-3 fats. Good choices include salmon, mackerel, sardines, tuna, flaxseed oil, and ground flaxseeds.  Limit saturated fats. These are in animal products such as meats, butter, and cream. They can also be in plant products such as palm oil, palm kernel oil, and coconut oil.  Avoid foods with partially hydrogenated oils in them. These contain trans fats. Examples of foods that have trans fats are stick margarine, some tub margarines, cookies, crackers, and other baked goods. What general guidelines do I need to follow?  Check food labels. Look for the words "trans fat" and "saturated fat."  When preparing a meal: ? Fill half of your plate with vegetables and green salads. ? Fill one fourth of your plate with whole grains. Look for the word "whole" as the first word in the ingredient list. ? Fill one fourth of your plate with lean protein foods.  Eat more foods that have fiber, like apples, carrots, beans, peas, and barley.  Eat more home-cooked foods. Eat less at restaurants and buffets.  Limit or avoid alcohol.  Limit foods high in starch and sugar.  Limit fried foods.  Cook foods without frying them. Baking, boiling, grilling, and broiling are all great options.  Lose weight if you are overweight. Losing even a small amount of weight can help your overall health. It can also help prevent diseases such as diabetes and heart disease. What foods can I eat? Grains Whole grains, such as whole wheat or whole grain breads, crackers, cereals, and pasta. Unsweetened oatmeal,  bulgur, barley, quinoa, or brown rice. Corn or whole wheat flour tortillas. Vegetables Fresh or frozen vegetables (raw, steamed, roasted, or grilled). Green salads. Fruits All fresh, canned (in natural juice), or frozen fruits. Meat and Other Protein Products Ground beef (85% or leaner), grass-fed beef, or beef trimmed of fat. Skinless chicken or turkey. Ground chicken or turkey. Pork trimmed of fat. All fish and seafood. Eggs. Dried beans, peas, or lentils. Unsalted nuts or seeds. Unsalted canned or dry beans. Dairy Low-fat dairy products, such as skim or 1% milk, 2% or reduced-fat cheeses, low-fat ricotta or cottage cheese, or plain low-fat yogurt. Fats and Oils Tub margarines without trans fats. Light or reduced-fat mayonnaise and salad dressings. Avocado. Olive, canola, sesame, or safflower oils. Natural peanut or almond butter (choose ones without added sugar and oil). The items listed above may not be a complete list of recommended foods or beverages. Contact your dietitian for more options. What foods are not recommended? Grains White bread. White pasta. White rice. Cornbread. Bagels, pastries, and croissants. Crackers that contain trans fat. Vegetables White potatoes. Corn. Creamed or fried vegetables. Vegetables in a cheese sauce. Fruits Dried fruits. Canned fruit in light or heavy syrup. Fruit juice. Meat and Other Protein Products Fatty cuts of meat. Ribs, chicken wings, bacon, sausage, bologna, salami, chitterlings, fatback, hot dogs, bratwurst, and packaged luncheon meats. Liver and organ meats. Dairy Whole or 2% milk, cream, half-and-half, and cream cheese. Whole milk cheeses. Whole-fat or sweetened yogurt. Full-fat cheeses. Nondairy creamers and whipped toppings. Processed cheese, cheese spreads, or cheese curds. Sweets and Desserts Corn   syrup, sugars, honey, and molasses. Candy. Jam and jelly. Syrup. Sweetened cereals. Cookies, pies, cakes, donuts, muffins, and ice  cream. Fats and Oils Butter, stick margarine, lard, shortening, ghee, or bacon fat. Coconut, palm kernel, or palm oils. Beverages Alcohol. Sweetened drinks (such as sodas, lemonade, and fruit drinks or punches). The items listed above may not be a complete list of foods and beverages to avoid. Contact your dietitian for more information. This information is not intended to replace advice given to you by your health care provider. Make sure you discuss any questions you have with your health care provider. Document Released: 03/26/2012 Document Revised: 06/01/2016 Document Reviewed: 12/25/2013 Elsevier Interactive Patient Education  2018 [employer]  

## 2018-05-15 NOTE — Assessment & Plan Note (Signed)
Controlled on Lisinopril HCT and Metoprolol Monitor BP

## 2018-05-15 NOTE — Assessment & Plan Note (Signed)
Controlled on Metoprolol and Eliquis

## 2018-05-15 NOTE — Progress Notes (Signed)
Subjective:    Patient ID: Lindsey Cline, female    DOB: 10-03-1919, 84 y.o.   MRN: [id number]  HPI  Asked to see resident in [address] for routine follow up. Reviewed with RN, no new concerns Her sleeps varies, she is often restless at night Independent with ADL's Walks with rolling walker HOH She has some urinary frequency and nocturia but no incontinence Her appetite is fair, weight is down 4 lbs over 2 months, bowels are okay She does c/o bilateral knee pain, taking Tylenol prn with some relief. She denies reflux or shortness of breath  Afib: Chronic but stable on Metoprolol and Eliquis.  HLD: No longer on a statin. She consumes a low fat diet for the most part.  HTN: Her BP today is 118/79. She is taking Lisinopril HCT and Metoprolol as prescribed. ECG from 11/2017 reviewed.  Osteoporosis: No recent falls. She is taking Vit D and Calcium.  Review of Systems      Past Medical History:  Diagnosis Date  . A-fib ([place])   . Heart murmur   . Hyperlipidemia   . Hypertension   . Kidney problem   . Macular degeneration   . Osteoporosis    osteoarthritis    Current Outpatient Medications  Medication Sig Dispense Refill  . apixaban (ELIQUIS) 2.5 MG TABS tablet Take 1 tablet (2.5 mg total) by mouth 2 (two) times daily. 60 tablet 6  . calcitRIOL (ROCALTROL) 0.25 MCG capsule Take 0.25 mcg by mouth daily.     . hydroxypropyl methylcellulose / hypromellose (ISOPTO TEARS / GONIOVISC) 2.5 % ophthalmic solution 1 drop 3 (three) times daily as needed for dry eyes.    Marland Kitchen lisinopril-hydrochlorothiazide (PRINZIDE,ZESTORETIC) 20-12.5 MG per tablet TAKE 1 TABLET BY MOUTH EVERY DAY 90 tablet 1  . metoprolol tartrate (LOPRESSOR) 25 MG tablet Take 1 tablet (25 mg total) by mouth 2 (two) times daily. 60 tablet 3  . Probiotic Product (PROBIOTIC DAILY PO) Take by mouth daily.    . Vitamin D, Ergocalciferol, (DRISDOL) 50000 units CAPS capsule Take 50,000 Units by mouth every 7 (seven) days.        No current facility-administered medications for this visit.     No Known Allergies  Family History  Problem Relation Age of Onset  . Cancer Mother        colon  . Heart disease Father     Social History   Socioeconomic History  . Marital status: Widowed    Spouse name: Not on file  . Number of children: 1  . Years of education: Not on file  . Highest education level: Not on file  Occupational History  . Occupation: Housewife  Social Needs  . Financial resource strain: Not on file  . Food insecurity:    Worry: Not on file    Inability: Not on file  . Transportation needs:    Medical: Not on file    Non-medical: Not on file  Tobacco Use  . Smoking status: Never Smoker  . Smokeless tobacco: Never Used  Substance and Sexual Activity  . Alcohol use: Yes    Alcohol/week: 1.2 oz    Types: 2 Glasses of wine per week    Comment: WINE 5 DAYS PER WEEK  . Drug use: No  . Sexual activity: Never  Lifestyle  . Physical activity:    Days per week: Not on file    Minutes per session: Not on file  . Stress: Not on file  Relationships  .  Social connections:    Talks on phone: Not on file    Gets together: Not on file    Attends religious service: Not on file    Active member of club or organization: Not on file    Attends meetings of clubs or organizations: Not on file    Relationship status: Not on file  . Intimate partner violence:    Fear of current or ex partner: Not on file    Emotionally abused: Not on file    Physically abused: Not on file    Forced sexual activity: Not on file  Other Topics Concern  . Not on file  Social History Narrative   Has a living will   Son HPOA   DNR     Constitutional: Denies fever, malaise, fatigue, headache or abrupt weight changes.  HEENT: Denies eye pain, eye redness, ear pain, ringing in the ears, wax buildup, runny nose, nasal congestion, bloody nose, or sore throat. Respiratory: Denies difficulty breathing, shortness of  breath, cough or sputum production.   Cardiovascular: Denies chest pain, chest tightness, palpitations or swelling in the hands or feet.  Gastrointestinal: Denies abdominal pain, bloating, constipation, diarrhea or blood in the stool.  GU: Pt reports urinary frequency, nocturia. Denies urgency, pain with urination, burning sensation, blood in urine, odor or discharge. Musculoskeletal: Pt reports bilateral knee pain and difficulty with gait. Denies decrease in range of motion, muscle pain or joint swelling.  Skin: Denies redness, rashes, lesions or ulcercations.  Neurological: Pt reports difficulty with memory. Denies dizziness, difficulty with speech or problems with balance and coordination.  Psych: Denies anxiety, depression, SI/HI.  No other specific complaints in a complete review of systems (except as listed in HPI above).  Objective:   Physical Exam BP 118/79   Pulse 67   Temp 98.5 F (36.9 C)   Resp 16   Wt 130 lb 3.2 oz (59.1 kg)   BMI 21.67 kg/m   Wt Readings from Last 3 Encounters:  01/10/18 134 lb (60.8 kg)  11/23/17 135 lb (61.2 kg)  10/19/17 133 lb (60.3 kg)    General: Appears her stated age, in NAD. Cardiovascular: Normal rate with irregular rhythm. S1,S2 noted.  No murmur, rubs or gallops noted. Trace BLE edema.  Pulmonary/Chest: Normal effort and positive vesicular breath sounds. No respiratory distress. No wheezes, rales or ronchi noted.  Abdomen: Soft and nontender. Normal bowel sounds. No distention or masses noted. Musculoskeletal: Normal flexion and extension of bilateral knees. Joint enlargement noted of bilateral knees. Gait slow and steady with use of rolling walker.  Neurological: Alert and oriented.    BMET    Component Value Date/Time   NA 140 01/03/2017 1010   NA 143 05/14/2012 0546   K 4.3 01/03/2017 1010   K 4.2 05/14/2012 0546   CL 103 01/03/2017 1010   CL 111 (H) 05/14/2012 0546   CO2 30 01/03/2017 1010   CO2 25 05/14/2012 0546   GLUCOSE  108 (H) 01/03/2017 1010   GLUCOSE 92 05/14/2012 0546   BUN 30 (H) 01/03/2017 1010   BUN 39 (H) 05/14/2012 0546   CREATININE 1.40 (H) 01/03/2017 1010   CREATININE 1.26 05/14/2012 0546   CALCIUM 9.0 01/03/2017 1010   CALCIUM 8.0 (L) 05/14/2012 0546   GFRNONAA 37 (L) 05/14/2012 0546   GFRAA 43 (L) 05/14/2012 0546    Lipid Panel     Component Value Date/Time   CHOL 233 (H) 01/03/2017 1010   TRIG 68.0 01/03/2017 1010  HDL 64.60 01/03/2017 1010   CHOLHDL 4 01/03/2017 1010   VLDL 13.6 01/03/2017 1010   LDLCALC 155 (H) 01/03/2017 1010    CBC    Component Value Date/Time   WBC 6.0 03/22/2015 1026   RBC 3.75 (L) 03/22/2015 1026   HGB 12.2 03/22/2015 1026   HGB 8.4 (L) 05/16/2012 0429   HCT 37.3 03/22/2015 1026   HCT 24.2 (L) 05/16/2012 0429   PLT 254.0 03/22/2015 1026   PLT 143 (L) 05/16/2012 0429   MCV 99.6 03/22/2015 1026   MCV 99 05/16/2012 0429   MCH 34.4 (H) 05/16/2012 0429   MCHC 32.7 03/22/2015 1026   RDW 14.7 03/22/2015 1026   RDW 15.0 (H) 05/16/2012 0429   LYMPHSABS 1.5 03/22/2015 1026   LYMPHSABS 1.7 05/16/2012 0429   MONOABS 0.5 03/22/2015 1026   MONOABS 0.5 05/16/2012 0429   EOSABS 0.1 03/22/2015 1026   EOSABS 0.2 05/16/2012 0429   BASOSABS 0.0 03/22/2015 1026   BASOSABS 0.1 05/16/2012 0429    Hgb A1C No results found for: HGBA1C           Assessment & Plan:

## 2018-05-15 NOTE — Assessment & Plan Note (Signed)
She continues to be weight bearing Continue Calcium and Vit D

## 2018-05-15 NOTE — Assessment & Plan Note (Signed)
Mild, no meds ALF appropriate

## 2018-05-15 NOTE — Assessment & Plan Note (Signed)
No issues off statin

## 2018-05-24 ENCOUNTER — Encounter: Payer: Self-pay | Admitting: Cardiovascular Disease

## 2018-05-24 ENCOUNTER — Ambulatory Visit (INDEPENDENT_AMBULATORY_CARE_PROVIDER_SITE_OTHER): Payer: Medicare Other | Admitting: Cardiovascular Disease

## 2018-05-24 VITALS — BP 130/70 | HR 77 | Ht 65.0 in | Wt 135.0 lb

## 2018-05-24 DIAGNOSIS — I482 Chronic atrial fibrillation, unspecified: Secondary | ICD-10-CM

## 2018-05-24 DIAGNOSIS — I10 Essential (primary) hypertension: Secondary | ICD-10-CM

## 2018-05-24 NOTE — Patient Instructions (Signed)
Medication Instructions: Your physician recommends that you continue on your current medications as directed. Please refer to the Current Medication list given to you today.  If you need a refill on your cardiac medications before your next appointment, please call your pharmacy.   Follow-Up: Your physician wants you to follow-up in 6 months with Dr. Arida. You will receive a reminder letter in the mail two months in advance. If you don't receive a letter, please call our office at [phone] to schedule this follow-up appointment.  Thank you for choosing [facility]!    

## 2018-05-24 NOTE — Progress Notes (Signed)
Cardiology Office Note   Date:  05/24/2018   ID:  Lindsey Cline, DOB 02-12-1919, MRN [id number]  PCP:  Lucille Passy, MD  Cardiologist:   Kathlyn Sacramento, MD   Chief Complaint  Patient presents with  . other    6 month f/u no complaints today denies chest pain/sob/edema/BP issues. Meds reviewed verbally with pt.      History of Present Illness: Lindsey Cline is a 84 y.o. female who presents for a follow-up visit regarding chronic atrial fibrillation.  Echocardiogram in April 2016 showed normal LV systolic function, moderate mitral regurgitation, severely dilated left atrium and mildly dilated right atrium.  She has been treated with rate control with metoprolol and anticoagulation with low dose Eliquis . She lives in [facility] facility.  She has been doing well with no chest pain, shortness of breath or palpitations.  No hospitalizations over the last 6 months.  She takes her medications regularly.  Past Medical History:  Diagnosis Date  . A-fib ([place])   . Heart murmur   . Hyperlipidemia   . Hypertension   . Kidney problem   . Macular degeneration   . Osteoporosis    osteoarthritis    Past Surgical History:  Procedure Laterality Date  . APPENDECTOMY  1942  . CATARACT EXTRACTION  1999  . INGUINAL HERNIA REPAIR  1946   Left  . TONSILLECTOMY  1928   ? Adenoids  . VAGINAL HYSTERECTOMY  1952     Current Outpatient Medications  Medication Sig Dispense Refill  . acetaminophen (TYLENOL) 325 MG tablet Take 650 mg by mouth 2 (two) times daily.    Marland Kitchen apixaban (ELIQUIS) 2.5 MG TABS tablet Take 1 tablet (2.5 mg total) by mouth 2 (two) times daily. 60 tablet 6  . calcitRIOL (ROCALTROL) 0.25 MCG capsule Take 0.25 mcg by mouth daily.     . hydroxypropyl methylcellulose / hypromellose (ISOPTO TEARS / GONIOVISC) 2.5 % ophthalmic solution 1 drop 3 (three) times daily as needed for dry eyes.    Marland Kitchen lisinopril-hydrochlorothiazide (PRINZIDE,ZESTORETIC) 20-12.5 MG per  tablet TAKE 1 TABLET BY MOUTH EVERY DAY 90 tablet 1  . metoprolol tartrate (LOPRESSOR) 25 MG tablet Take 1 tablet (25 mg total) by mouth 2 (two) times daily. 60 tablet 3  . Nutritional Supplements (NUTRITIONAL SUPPLEMENT PO) Take by mouth 2 (two) times daily.    . Probiotic Product (PROBIOTIC DAILY PO) Take by mouth daily.    . Vitamin D, Ergocalciferol, (DRISDOL) 50000 units CAPS capsule Take 50,000 Units by mouth every 7 (seven) days.      No current facility-administered medications for this visit.     Allergies:   Patient has no known allergies.    Social History:  The patient  reports that she has never smoked. She has never used smokeless tobacco. She reports that she drinks about 2.0 standard drinks of alcohol per week. She reports that she does not use drugs.   Family History:  The patient's family history includes Cancer in her mother; Heart disease in her father.    ROS:  Please see the history of present illness.   Otherwise, review of systems are positive for none.   All other systems are reviewed and negative.    PHYSICAL EXAM: VS:  BP 130/70 (BP Location: Left Arm, Patient Position: Sitting, Cuff Size: Normal)   Pulse 77   Ht 5\' 5"  (1.651 m)   Wt 135 lb (61.2 kg)   BMI 22.47 kg/m  ,  BMI Body mass index is 22.47 kg/m. GEN: Well nourished, well developed, in no acute distress  HEENT: normal  Neck: no JVD, carotid bruits, or masses Cardiac: Irregularly irregular   ; no murmurs, rubs, or gallops,no edema  Respiratory:  clear to auscultation bilaterally, normal work of breathing GI: soft, nontender, nondistended, + BS MS: no deformity or atrophy  Skin: warm and dry, no rash Neuro:  Strength and sensation are intact Psych: euthymic mood, full affect   EKG:  EKG is ordered today. The ekg ordered today demonstrates atrial fibrillation with low voltage and nonspecific ST and T wave changes.  Recent Labs: No results found for requested labs within last 8760 hours.     Lipid Panel    Component Value Date/Time   CHOL 233 (H) 01/03/2017 1010   TRIG 68.0 01/03/2017 1010   HDL 64.60 01/03/2017 1010   CHOLHDL 4 01/03/2017 1010   VLDL 13.6 01/03/2017 1010   LDLCALC 155 (H) 01/03/2017 1010   LDLDIRECT 128.8 05/29/2008 1251      Wt Readings from Last 3 Encounters:  05/24/18 135 lb (61.2 kg)  05/15/18 130 lb 3.2 oz (59.1 kg)  01/10/18 134 lb (60.8 kg)        ASSESSMENT AND PLAN:  1.  Chronic atrial fibrillation: Ventricular rate is well controlled on metoprolol. Continue long-term anticoagulation with low-dose Eliquis.  I reviewed her labs in May which showed stable chronic kidney disease and hemoglobin.  2. Essential hypertension: Blood pressure is reasonably controlled on current medications.    Disposition:   FU with me in 6 months  Signed,  Kathlyn Sacramento, MD  05/24/2018 10:05 AM    [place]

## 2018-06-12 DIAGNOSIS — H02051 Trichiasis without entropian right upper eyelid: Secondary | ICD-10-CM | POA: Diagnosis not present

## 2018-06-12 DIAGNOSIS — H353112 Nonexudative age-related macular degeneration, right eye, intermediate dry stage: Secondary | ICD-10-CM | POA: Diagnosis not present

## 2018-07-11 DIAGNOSIS — R809 Proteinuria, unspecified: Secondary | ICD-10-CM | POA: Diagnosis not present

## 2018-07-11 DIAGNOSIS — N184 Chronic kidney disease, stage 4 (severe): Secondary | ICD-10-CM | POA: Diagnosis not present

## 2018-07-11 DIAGNOSIS — I10 Essential (primary) hypertension: Secondary | ICD-10-CM | POA: Diagnosis not present

## 2018-08-01 ENCOUNTER — Ambulatory Visit: Payer: Medicare Other | Admitting: Internal Medicine

## 2018-08-01 ENCOUNTER — Encounter: Payer: Self-pay | Admitting: Internal Medicine

## 2018-08-01 VITALS — BP 112/72 | HR 86 | Temp 97.9°F | Resp 16 | Wt 130.2 lb

## 2018-08-01 DIAGNOSIS — I10 Essential (primary) hypertension: Secondary | ICD-10-CM

## 2018-08-01 DIAGNOSIS — N183 Chronic kidney disease, stage 3 unspecified: Secondary | ICD-10-CM

## 2018-08-01 DIAGNOSIS — I482 Chronic atrial fibrillation, unspecified: Secondary | ICD-10-CM | POA: Diagnosis not present

## 2018-08-01 DIAGNOSIS — R413 Other amnesia: Secondary | ICD-10-CM

## 2018-08-01 NOTE — Assessment & Plan Note (Signed)
Rate is fine with metoprolol On eliquis

## 2018-08-01 NOTE — Assessment & Plan Note (Signed)
No symptoms On calcitriol and vitamin D Will monitor periodically

## 2018-08-01 NOTE — Assessment & Plan Note (Signed)
BP Readings from Last 3 Encounters:  08/01/18 112/72  05/24/18 130/70  05/15/18 118/79   Good control

## 2018-08-01 NOTE — Progress Notes (Signed)
Subjective:    Patient ID: Lindsey Cline, female    DOB: 1919-02-21, 84 y.o.   MRN: [id number]  HPI Visit in [facility] apartment for follow up of chronic health conditions Reviewed status with [name] RN  Still satisfied here Just turned 26 Able to walk with rollator---and tries to do some extra walking inside every day Remains independent with ADLs---just gets assistance with medications and laundry Occasional urinary leakage--wears pull up just in case  No palpitations No chest pain or SOB Takes her time with longer walks---like to hair salon No dizziness or syncope No sig edema  Most recent labs show stable GFR in low 30's  Mild pain in hands/thumbs Hasn't needed meds  Current Outpatient Medications on File Prior to Visit  Medication Sig Dispense Refill  . acetaminophen (TYLENOL) 325 MG tablet Take 650 mg by mouth 2 (two) times daily.    Marland Kitchen apixaban (ELIQUIS) 2.5 MG TABS tablet Take 1 tablet (2.5 mg total) by mouth 2 (two) times daily. 60 tablet 6  . calcitRIOL (ROCALTROL) 0.25 MCG capsule Take 0.25 mcg by mouth daily.     . hydroxypropyl methylcellulose / hypromellose (ISOPTO TEARS / GONIOVISC) 2.5 % ophthalmic solution 1 drop 3 (three) times daily as needed for dry eyes.    Marland Kitchen lisinopril-hydrochlorothiazide (PRINZIDE,ZESTORETIC) 20-12.5 MG per tablet TAKE 1 TABLET BY MOUTH EVERY DAY 90 tablet 1  . metoprolol tartrate (LOPRESSOR) 25 MG tablet Take 1 tablet (25 mg total) by mouth 2 (two) times daily. 60 tablet 3  . Nutritional Supplements (NUTRITIONAL SUPPLEMENT PO) Take by mouth 2 (two) times daily.    . Probiotic Product (PROBIOTIC DAILY PO) Take by mouth daily.    . Vitamin D, Ergocalciferol, (DRISDOL) 50000 units CAPS capsule Take 50,000 Units by mouth every 7 (seven) days.      No current facility-administered medications on file prior to visit.     No Known Allergies  Past Medical History:  Diagnosis Date  . A-fib ([place])   . Heart murmur   . Hyperlipidemia   .  Hypertension   . Kidney problem   . Macular degeneration   . Osteoporosis    osteoarthritis    Past Surgical History:  Procedure Laterality Date  . APPENDECTOMY  1942  . CATARACT EXTRACTION  1999  . INGUINAL HERNIA REPAIR  1946   Left  . TONSILLECTOMY  1928   ? Adenoids  . VAGINAL HYSTERECTOMY  1952    Family History  Problem Relation Age of Onset  . Cancer Mother        colon  . Heart disease Father     Social History   Socioeconomic History  . Marital status: Widowed    Spouse name: Not on file  . Number of children: 1  . Years of education: Not on file  . Highest education level: Not on file  Occupational History  . Occupation: Housewife  Social Needs  . Financial resource strain: Not on file  . Food insecurity:    Worry: Not on file    Inability: Not on file  . Transportation needs:    Medical: Not on file    Non-medical: Not on file  Tobacco Use  . Smoking status: Never Smoker  . Smokeless tobacco: Never Used  Substance and Sexual Activity  . Alcohol use: Yes    Alcohol/week: 2.0 standard drinks    Types: 2 Glasses of wine per week    Comment: WINE 5 DAYS PER WEEK  . Drug use:  No  . Sexual activity: Never  Lifestyle  . Physical activity:    Days per week: Not on file    Minutes per session: Not on file  . Stress: Not on file  Relationships  . Social connections:    Talks on phone: Not on file    Gets together: Not on file    Attends religious service: Not on file    Active member of club or organization: Not on file    Attends meetings of clubs or organizations: Not on file    Relationship status: Not on file  . Intimate partner violence:    Fear of current or ex partner: Not on file    Emotionally abused: Not on file    Physically abused: Not on file    Forced sexual activity: Not on file  Other Topics Concern  . Not on file  Social History Narrative   Has a living will   Son HPOA   DNR   Review of Systems Appetite is not that  great--but she eats Weight is fairly stable Sleeps okay----up once to void. Occasionally lonely for husband ---many years No foot/leg ulcers now bowells are okay    Objective:   Physical Exam  Constitutional: She appears well-developed. No distress.  Cardiovascular: Normal rate, normal heart sounds and intact distal pulses.  No murmur heard. irregular  Respiratory: Effort normal and breath sounds normal. No respiratory distress. She has no wheezes.  GI: Soft. There is no tenderness.  Musculoskeletal: She exhibits no edema or tenderness.  Lymphadenopathy:    She has no cervical adenopathy.  Skin:  No foot ulcers  Psychiatric: She has a normal mood and affect. Her behavior is normal.           Assessment & Plan:

## 2018-08-01 NOTE — Assessment & Plan Note (Signed)
Mild No functional decline

## 2018-08-30 DIAGNOSIS — N183 Chronic kidney disease, stage 3 (moderate): Secondary | ICD-10-CM | POA: Diagnosis not present

## 2018-08-30 DIAGNOSIS — I10 Essential (primary) hypertension: Secondary | ICD-10-CM | POA: Diagnosis not present

## 2018-08-30 DIAGNOSIS — N2581 Secondary hyperparathyroidism of renal origin: Secondary | ICD-10-CM | POA: Diagnosis not present

## 2018-08-30 DIAGNOSIS — R600 Localized edema: Secondary | ICD-10-CM | POA: Diagnosis not present

## 2018-08-30 DIAGNOSIS — R809 Proteinuria, unspecified: Secondary | ICD-10-CM | POA: Diagnosis not present

## 2018-10-25 ENCOUNTER — Ambulatory Visit: Payer: Medicare Other | Admitting: Internal Medicine

## 2018-10-25 VITALS — BP 134/76 | HR 74 | Temp 97.8°F | Resp 16 | Wt 122.2 lb

## 2018-10-25 DIAGNOSIS — N183 Chronic kidney disease, stage 3 unspecified: Secondary | ICD-10-CM

## 2018-10-25 DIAGNOSIS — I482 Chronic atrial fibrillation, unspecified: Secondary | ICD-10-CM | POA: Diagnosis not present

## 2018-10-25 DIAGNOSIS — I10 Essential (primary) hypertension: Secondary | ICD-10-CM | POA: Diagnosis not present

## 2018-10-25 DIAGNOSIS — M810 Age-related osteoporosis without current pathological fracture: Secondary | ICD-10-CM | POA: Diagnosis not present

## 2018-10-25 DIAGNOSIS — R609 Edema, unspecified: Secondary | ICD-10-CM | POA: Diagnosis not present

## 2018-10-25 DIAGNOSIS — E7800 Pure hypercholesterolemia, unspecified: Secondary | ICD-10-CM | POA: Diagnosis not present

## 2018-10-25 DIAGNOSIS — M159 Polyosteoarthritis, unspecified: Secondary | ICD-10-CM | POA: Diagnosis not present

## 2018-10-27 ENCOUNTER — Encounter: Payer: Self-pay | Admitting: Internal Medicine

## 2018-10-27 NOTE — Assessment & Plan Note (Signed)
Regular today Continue metoprolol and Eliquis

## 2018-10-27 NOTE — Progress Notes (Signed)
Subjective:    Patient ID: Lindsey Cline, female    DOB: 1919/05/08, 85 y.o.   MRN: [id number]  HPI  Asked to see resident in [address] for routine follow-up Reviewed with RN, no new concerns Resident's son and daughter-in-law present, concerned about swelling in bilateral lower legs.  Son wants to make sure that resident is wearing TED hose daily.  RN reports that it is documented that resident often refuses TED hose.  Her sleep varies.  She is independent with her ADLs.  She walks with a rolling walker.  Her appetite is fair, weight is stable.  She does complain of some intermittent heartburn.  She does have some stress incontinence but no continuous leakage.  Her bowels are okay.  She denies chest pain or shortness of breath.  Osteoporosis: She is still weightbearing.  She is taking vitamin D and calcitriol as prescribed.  CKD: Creatinine 1.44, GFR 35.  She is on Lisinopril.  HTN: Controlled on Lisinopril-HCTZ and Metoprolol.  ECG from 05/2018 reviewed.  HLD: Currently not on a statin.  She tries to consume a low-fat diet.  Afib: Rate controlled.  She is taking metoprolol and Eliquis as prescribed.  OA: Mainly in her knees.  She takes Tylenol with adequate relief.  Review of Systems      Past Medical History:  Diagnosis Date  . A-fib ([place])   . Heart murmur   . Hyperlipidemia   . Hypertension   . Kidney problem   . Macular degeneration   . Osteoporosis    osteoarthritis    Current Outpatient Medications  Medication Sig Dispense Refill  . acetaminophen (TYLENOL) 325 MG tablet Take 650 mg by mouth 2 (two) times daily.    Marland Kitchen apixaban (ELIQUIS) 2.5 MG TABS tablet Take 1 tablet (2.5 mg total) by mouth 2 (two) times daily. 60 tablet 6  . calcitRIOL (ROCALTROL) 0.25 MCG capsule Take 0.25 mcg by mouth daily.     . hydroxypropyl methylcellulose / hypromellose (ISOPTO TEARS / GONIOVISC) 2.5 % ophthalmic solution 1 drop 3 (three) times daily as needed for dry eyes.    Marland Kitchen  lisinopril-hydrochlorothiazide (PRINZIDE,ZESTORETIC) 20-12.5 MG per tablet TAKE 1 TABLET BY MOUTH EVERY DAY 90 tablet 1  . metoprolol tartrate (LOPRESSOR) 25 MG tablet Take 1 tablet (25 mg total) by mouth 2 (two) times daily. 60 tablet 3  . Nutritional Supplements (NUTRITIONAL SUPPLEMENT PO) Take by mouth 2 (two) times daily.    . Probiotic Product (PROBIOTIC DAILY PO) Take by mouth daily.    . Vitamin D, Ergocalciferol, (DRISDOL) 50000 units CAPS capsule Take 50,000 Units by mouth every 7 (seven) days.      No current facility-administered medications for this visit.     No Known Allergies  Family History  Problem Relation Age of Onset  . Cancer Mother        colon  . Heart disease Father     Social History   Socioeconomic History  . Marital status: Widowed    Spouse name: Not on file  . Number of children: 1  . Years of education: Not on file  . Highest education level: Not on file  Occupational History  . Occupation: Housewife  Social Needs  . Financial resource strain: Not on file  . Food insecurity:    Worry: Not on file    Inability: Not on file  . Transportation needs:    Medical: Not on file    Non-medical: Not on file  Tobacco Use  .  Smoking status: Never Smoker  . Smokeless tobacco: Never Used  Substance and Sexual Activity  . Alcohol use: Yes    Alcohol/week: 2.0 standard drinks    Types: 2 Glasses of wine per week    Comment: WINE 5 DAYS PER WEEK  . Drug use: No  . Sexual activity: Never  Lifestyle  . Physical activity:    Days per week: Not on file    Minutes per session: Not on file  . Stress: Not on file  Relationships  . Social connections:    Talks on phone: Not on file    Gets together: Not on file    Attends religious service: Not on file    Active member of club or organization: Not on file    Attends meetings of clubs or organizations: Not on file    Relationship status: Not on file  . Intimate partner violence:    Fear of current or ex  partner: Not on file    Emotionally abused: Not on file    Physically abused: Not on file    Forced sexual activity: Not on file  Other Topics Concern  . Not on file  Social History Narrative   Has a living will   Son HPOA   DNR     Constitutional: Denies fever, malaise, fatigue, headache or abrupt weight changes.  HEENT: Denies eye pain, eye redness, ear pain, ringing in the ears, wax buildup, runny nose, nasal congestion, bloody nose, or sore throat. Respiratory: Denies difficulty breathing, shortness of breath, cough or sputum production.   Cardiovascular: Patient reports swelling in legs.  Denies chest pain, chest tightness, palpitations or swelling in the hands.  Gastrointestinal: Patient reports intermittent reflux.  Denies abdominal pain, bloating, constipation, diarrhea or blood in the stool.  GU: Patient reports stress incontinence.  Denies urgency, frequency, pain with urination, burning sensation, blood in urine, odor or discharge. Musculoskeletal: Patient reports intermittent joint pain.  Denies decrease in range of motion, difficulty with gait, muscle pain or joint swelling.  Skin: Denies redness, rashes, lesions or ulcercations.  Neurological: Patient reports issues with balance.  Denies dizziness, difficulty with memory, difficulty with speech or problems with coordination.  Psych: Denies anxiety, depression, SI/HI.  No other specific complaints in a complete review of systems (except as listed in HPI above).  Objective:   Physical Exam   BP 134/76   Pulse 74   Temp 97.8 F (36.6 C)   Resp 16   Wt 122 lb 3.2 oz (55.4 kg)   BMI 20.34 kg/m  Wt Readings from Last 3 Encounters:  10/27/18 122 lb 3.2 oz (55.4 kg)  08/01/18 130 lb 3.2 oz (59.1 kg)  05/24/18 135 lb (61.2 kg)    General: Appears her stated age, well developed, well nourished in NAD. Skin: Warm, dry and intact.  Neck:  Neck supple, trachea midline. No masses, lumps or thyromegaly present.    Cardiovascular: Normal rate and rhythm. S1,S2 noted.  No murmur, rubs or gallops noted.  1+ pitting BLE edema. Pulmonary/Chest: Normal effort and positive vesicular breath sounds. No respiratory distress. No wheezes, rales or ronchi noted.  Abdomen: Soft and nontender. Normal bowel sounds. No distention or masses noted.  Musculoskeletal: Gait slow and steady with use of rolling walker.  Neurological: Alert and oriented.   BMET    Component Value Date/Time   NA 140 01/03/2017 1010   NA 143 05/14/2012 0546   K 4.3 01/03/2017 1010   K 4.2 05/14/2012 0546  CL 103 01/03/2017 1010   CL 111 (H) 05/14/2012 0546   CO2 30 01/03/2017 1010   CO2 25 05/14/2012 0546   GLUCOSE 108 (H) 01/03/2017 1010   GLUCOSE 92 05/14/2012 0546   BUN 30 (H) 01/03/2017 1010   BUN 39 (H) 05/14/2012 0546   CREATININE 1.40 (H) 01/03/2017 1010   CREATININE 1.26 05/14/2012 0546   CALCIUM 9.0 01/03/2017 1010   CALCIUM 8.0 (L) 05/14/2012 0546   GFRNONAA 37 (L) 05/14/2012 0546   GFRAA 43 (L) 05/14/2012 0546    Lipid Panel     Component Value Date/Time   CHOL 233 (H) 01/03/2017 1010   TRIG 68.0 01/03/2017 1010   HDL 64.60 01/03/2017 1010   CHOLHDL 4 01/03/2017 1010   VLDL 13.6 01/03/2017 1010   LDLCALC 155 (H) 01/03/2017 1010    CBC    Component Value Date/Time   WBC 6.0 03/22/2015 1026   RBC 3.75 (L) 03/22/2015 1026   HGB 12.2 03/22/2015 1026   HGB 8.4 (L) 05/16/2012 0429   HCT 37.3 03/22/2015 1026   HCT 24.2 (L) 05/16/2012 0429   PLT 254.0 03/22/2015 1026   PLT 143 (L) 05/16/2012 0429   MCV 99.6 03/22/2015 1026   MCV 99 05/16/2012 0429   MCH 34.4 (H) 05/16/2012 0429   MCHC 32.7 03/22/2015 1026   RDW 14.7 03/22/2015 1026   RDW 15.0 (H) 05/16/2012 0429   LYMPHSABS 1.5 03/22/2015 1026   LYMPHSABS 1.7 05/16/2012 0429   MONOABS 0.5 03/22/2015 1026   MONOABS 0.5 05/16/2012 0429   EOSABS 0.1 03/22/2015 1026   EOSABS 0.2 05/16/2012 0429   BASOSABS 0.0 03/22/2015 1026   BASOSABS 0.1 05/16/2012  0429    Hgb A1C No results found for: HGBA1C         Assessment & Plan:   Peripheral edema:  Encouraged her to wear her TED hose daily Encourage elevation Discussed low-salt diet  We will reassess as needed Webb Silversmith, NP

## 2018-10-27 NOTE — Assessment & Plan Note (Signed)
Creatinine and GFR reviewed Continue lisinopril

## 2018-10-27 NOTE — Patient Instructions (Signed)
Peripheral Edema  Peripheral edema is swelling that is caused by a buildup of fluid. Peripheral edema most often affects the lower legs, ankles, and feet. It can also develop in the arms, hands, and face. The area of the body that has peripheral edema will look swollen. It may also feel heavy or warm. Your clothes may start to feel tight. Pressing on the area may make a temporary dent in your skin. You may not be able to move your arm or leg as much as usual. There are many causes of peripheral edema. It can be a complication of other diseases, such as congestive heart failure, kidney disease, or a problem with your blood circulation. It also can be a side effect of certain medicines. It often happens to women during pregnancy. Sometimes, the cause is not known. Treating the underlying condition is often the only treatment for peripheral edema. Follow these instructions at home: Pay attention to any changes in your symptoms. Take these actions to help with your discomfort:  Raise (elevate) your legs while you are sitting or lying down.  Move around often to prevent stiffness and to lessen swelling. Do not sit or stand for long periods of time.  Wear support stockings as told by your health care provider.  Follow instructions from your health care provider about limiting salt (sodium) in your diet. Sometimes eating less salt can reduce swelling.  Take over-the-counter and prescription medicines only as told by your health care provider. Your health care provider may prescribe medicine to help your body get rid of excess water (diuretic).  Keep all follow-up visits as told by your health care provider. This is important. Contact a health care provider if:  You have a fever.  Your edema starts suddenly or is getting worse, especially if you are pregnant or have a medical condition.  You have swelling in only one leg.  You have increased swelling and pain in your legs. Get help right away  if:  You develop shortness of breath, especially when you are lying down.  You have pain in your chest or abdomen.  You feel weak.  You faint. This information is not intended to replace advice given to you by your health care provider. Make sure you discuss any questions you have with your health care provider. Document Released: 11/02/2004 Document Revised: 02/28/2016 Document Reviewed: 04/07/2015 Elsevier Interactive Patient Education  2019 [employer].

## 2018-10-27 NOTE — Assessment & Plan Note (Signed)
No issue off statin therapy

## 2018-10-27 NOTE — Assessment & Plan Note (Signed)
Pain controlled with Tylenol

## 2018-10-27 NOTE — Assessment & Plan Note (Signed)
Controlled on lisinopril-HCTZ and metoprolol We will monitor

## 2018-10-27 NOTE — Assessment & Plan Note (Signed)
Continue calcitriol and vitamin D Encouraged continued weightbearing

## 2018-11-06 ENCOUNTER — Ambulatory Visit (INDEPENDENT_AMBULATORY_CARE_PROVIDER_SITE_OTHER): Payer: Medicare Other | Admitting: Internal Medicine

## 2018-11-06 VITALS — BP 127/59 | HR 79 | Temp 97.6°F | Resp 16 | Wt 134.0 lb

## 2018-11-06 DIAGNOSIS — M79674 Pain in right toe(s): Secondary | ICD-10-CM | POA: Diagnosis not present

## 2018-11-06 DIAGNOSIS — M79675 Pain in left toe(s): Secondary | ICD-10-CM

## 2018-11-06 DIAGNOSIS — B351 Tinea unguium: Secondary | ICD-10-CM | POA: Diagnosis not present

## 2018-11-10 ENCOUNTER — Encounter: Payer: Self-pay | Admitting: Internal Medicine

## 2018-11-10 NOTE — Progress Notes (Signed)
Subjective:    Patient ID: Lindsey Cline, female    DOB: December 13, 1918, 85 y.o.   MRN: [id number]  HPI  Asked to see resident in [address] Requesting nail trim Nails thick, long, getting caught on socks causing pain  Review of Systems      Past Medical History:  Diagnosis Date  . A-fib ([place])   . Heart murmur   . Hyperlipidemia   . Hypertension   . Kidney problem   . Macular degeneration   . Osteoporosis    osteoarthritis    Current Outpatient Medications  Medication Sig Dispense Refill  . acetaminophen (TYLENOL) 325 MG tablet Take 650 mg by mouth 2 (two) times daily.    Marland Kitchen apixaban (ELIQUIS) 2.5 MG TABS tablet Take 1 tablet (2.5 mg total) by mouth 2 (two) times daily. 60 tablet 6  . calcitRIOL (ROCALTROL) 0.25 MCG capsule Take 0.25 mcg by mouth daily.     . hydroxypropyl methylcellulose / hypromellose (ISOPTO TEARS / GONIOVISC) 2.5 % ophthalmic solution 1 drop 3 (three) times daily as needed for dry eyes.    Marland Kitchen lisinopril-hydrochlorothiazide (PRINZIDE,ZESTORETIC) 20-12.5 MG per tablet TAKE 1 TABLET BY MOUTH EVERY DAY 90 tablet 1  . metoprolol tartrate (LOPRESSOR) 25 MG tablet Take 1 tablet (25 mg total) by mouth 2 (two) times daily. 60 tablet 3  . Nutritional Supplements (NUTRITIONAL SUPPLEMENT PO) Take by mouth 2 (two) times daily.    . Probiotic Product (PROBIOTIC DAILY PO) Take by mouth daily.    . Vitamin D, Ergocalciferol, (DRISDOL) 50000 units CAPS capsule Take 50,000 Units by mouth every 7 (seven) days.      No current facility-administered medications for this visit.     No Known Allergies  Family History  Problem Relation Age of Onset  . Cancer Mother        colon  . Heart disease Father     Social History   Socioeconomic History  . Marital status: Widowed    Spouse name: Not on file  . Number of children: 1  . Years of education: Not on file  . Highest education level: Not on file  Occupational History  . Occupation: Housewife  Social Needs  .  Financial resource strain: Not on file  . Food insecurity:    Worry: Not on file    Inability: Not on file  . Transportation needs:    Medical: Not on file    Non-medical: Not on file  Tobacco Use  . Smoking status: Never Smoker  . Smokeless tobacco: Never Used  Substance and Sexual Activity  . Alcohol use: Yes    Alcohol/week: 2.0 standard drinks    Types: 2 Glasses of wine per week    Comment: WINE 5 DAYS PER WEEK  . Drug use: No  . Sexual activity: Never  Lifestyle  . Physical activity:    Days per week: Not on file    Minutes per session: Not on file  . Stress: Not on file  Relationships  . Social connections:    Talks on phone: Not on file    Gets together: Not on file    Attends religious service: Not on file    Active member of club or organization: Not on file    Attends meetings of clubs or organizations: Not on file    Relationship status: Not on file  . Intimate partner violence:    Fear of current or ex partner: Not on file    Emotionally abused:  Not on file    Physically abused: Not on file    Forced sexual activity: Not on file  Other Topics Concern  . Not on file  Social History Narrative   Has a living will   Son HPOA   DNR     Constitutional: Denies fever, malaise, fatigue, headache or abrupt weight changes.  Skin: Pt reports long, thick discolored toenails.   No other specific complaints in a complete review of systems (except as listed in HPI above).  Objective:   Physical Exam   BP (!) 127/59   Pulse 79   Temp 97.6 F (36.4 C)   Resp 16   Wt 134 lb (60.8 kg)   BMI 22.30 kg/m  Wt Readings from Last 3 Encounters:  11/10/18 134 lb (60.8 kg)  10/27/18 122 lb 3.2 oz (55.4 kg)  08/01/18 130 lb 3.2 oz (59.1 kg)    General: Appears her stated age, well developed, well nourished in NAD. Skin: Mycotic toenails bilaterally.  BMET    Component Value Date/Time   NA 140 01/03/2017 1010   NA 143 05/14/2012 0546   K 4.3 01/03/2017 1010    K 4.2 05/14/2012 0546   CL 103 01/03/2017 1010   CL 111 (H) 05/14/2012 0546   CO2 30 01/03/2017 1010   CO2 25 05/14/2012 0546   GLUCOSE 108 (H) 01/03/2017 1010   GLUCOSE 92 05/14/2012 0546   BUN 30 (H) 01/03/2017 1010   BUN 39 (H) 05/14/2012 0546   CREATININE 1.40 (H) 01/03/2017 1010   CREATININE 1.26 05/14/2012 0546   CALCIUM 9.0 01/03/2017 1010   CALCIUM 8.0 (L) 05/14/2012 0546   GFRNONAA 37 (L) 05/14/2012 0546   GFRAA 43 (L) 05/14/2012 0546    Lipid Panel     Component Value Date/Time   CHOL 233 (H) 01/03/2017 1010   TRIG 68.0 01/03/2017 1010   HDL 64.60 01/03/2017 1010   CHOLHDL 4 01/03/2017 1010   VLDL 13.6 01/03/2017 1010   LDLCALC 155 (H) 01/03/2017 1010    CBC    Component Value Date/Time   WBC 6.0 03/22/2015 1026   RBC 3.75 (L) 03/22/2015 1026   HGB 12.2 03/22/2015 1026   HGB 8.4 (L) 05/16/2012 0429   HCT 37.3 03/22/2015 1026   HCT 24.2 (L) 05/16/2012 0429   PLT 254.0 03/22/2015 1026   PLT 143 (L) 05/16/2012 0429   MCV 99.6 03/22/2015 1026   MCV 99 05/16/2012 0429   MCH 34.4 (H) 05/16/2012 0429   MCHC 32.7 03/22/2015 1026   RDW 14.7 03/22/2015 1026   RDW 15.0 (H) 05/16/2012 0429   LYMPHSABS 1.5 03/22/2015 1026   LYMPHSABS 1.7 05/16/2012 0429   MONOABS 0.5 03/22/2015 1026   MONOABS 0.5 05/16/2012 0429   EOSABS 0.1 03/22/2015 1026   EOSABS 0.2 05/16/2012 0429   BASOSABS 0.0 03/22/2015 1026   BASOSABS 0.1 05/16/2012 0429    Hgb A1C No results found for: HGBA1C         Assessment & Plan:   Pain due to Mycotic Toenails:  Nails 1-5 b/l feet trimmed by provided using dremmel tool Pt tolerated well No complications  Return precautions discussed Webb Silversmith, NP

## 2018-11-10 NOTE — Patient Instructions (Signed)
Fungal Nail Infection A fungal nail infection is a common infection of the toenails or fingernails. This condition affects toenails more often than fingernails. It often affects the great, or big, toes. More than one nail may be infected. The condition can be passed from person to person (is contagious). What are the causes? This condition is caused by a fungus. Several types of fungi can cause the infection. These fungi are common in moist and warm areas. If your hands or feet come into contact with the fungus, it may get into a crack in your fingernail or toenail and cause the infection. What increases the risk? The following factors may make you more likely to develop this condition:  Being female.  Being of older age.  Living with someone who has the fungus.  Walking barefoot in areas where the fungus thrives, such as showers or locker rooms.  Wearing shoes and socks that cause your feet to sweat.  Having a nail injury or a recent nail surgery.  Having certain medical conditions, such as: ? Athlete's foot. ? Diabetes. ? Psoriasis. ? Poor circulation. ? A weak body defense system (immune system). What are the signs or symptoms? Symptoms of this condition include:  A pale spot on the nail.  Thickening of the nail.  A nail that becomes yellow or brown.  A brittle or ragged nail edge.  A crumbling nail.  A nail that has lifted away from the nail bed. How is this diagnosed? This condition is diagnosed with a physical exam. Your health care provider may take a scraping or clipping from your nail to test for the fungus. How is this treated? Treatment is not needed for mild infections. If you have significant nail changes, treatment may include:  Antifungal medicines taken by mouth (orally). You may need to take the medicine for several weeks or several months, and you may not see the results for a long time. These medicines can cause side effects. Ask your health care provider  what problems to watch for.  Antifungal nail polish or nail cream. These may be used along with oral antifungal medicines.  Laser treatment of the nail.  Surgery to remove the nail. This may be needed for the most severe infections. It can take a long time, usually up to a year, for the infection to go away. The infection may also come back. Follow these instructions at home: Medicines  Take or apply over-the-counter and prescription medicines only as told by your health care provider.  Ask your health care provider about using over-the-counter mentholated ointment on your nails. Nail care  Trim your nails often.  Wash and dry your hands and feet every day.  Keep your feet dry: ? Wear absorbent socks, and change your socks frequently. ? Wear shoes that allow air to circulate, such as sandals or canvas tennis shoes. Throw out old shoes.  Do not use artificial nails.  If you go to a nail salon, make sure you choose one that uses clean instruments.  Use antifungal foot powder on your feet and in your shoes. General instructions  Do not share personal items, such as towels or nail clippers.  Do not walk barefoot in shower rooms or locker rooms.  Wear rubber gloves if you are working with your hands in wet areas.  Keep all follow-up visits as told by your health care provider. This is important. Contact a health care provider if: Your infection is not getting better or it is getting worse   after several months. Summary  A fungal nail infection is a common infection of the toenails or fingernails.  Treatment is not needed for mild infections. If you have significant nail changes, treatment may include taking medicine orally and applying medicine to your nails.  It can take a long time, usually up to a year, for the infection to go away. The infection may also come back.  Take or apply over-the-counter and prescription medicines only as told by your health care  provider.  Follow instructions for taking care of your nails to help prevent infection from coming back or spreading. This information is not intended to replace advice given to you by your health care provider. Make sure you discuss any questions you have with your health care provider. Document Released: 09/22/2000 Document Revised: 03/01/2018 Document Reviewed: 03/01/2018 Elsevier Interactive Patient Education  2019 [employer]  

## 2018-11-19 NOTE — Progress Notes (Signed)
Cardiology Office Note   Date:  11/21/2018   ID:  CARLINE [name], DOB [birth year], MRN [id number]  PCP:  Lindsey Carbon, MD  Cardiologist:   Lindsey Sacramento, MD  Chief Complaint  Patient presents with  . Other    6 month follow up. Patient c/o Swelling in ankles and legs. Meds reviewed verbally with patient.       History of Present Illness: Lindsey Cline is a 85 y.o. female who presents for a follow-up visit regarding chronic atrial fibrillation.  Echocardiogram in April 2016 showed normal LV systolic function, moderate mitral regurgitation, severely dilated left atrium and mildly dilated right atrium.  She has been treated with rate control with metoprolol and anticoagulation with low dose Eliquis . [facility] lives in [facility].  She is has been doing well overall. She wears compression socks everyday. No recent hospital visits. Per her family member, she was seen by an ENT specialist who noted she had a significant drop in hearing and understanding what she is being told. She will be following up with a Neurologist in the near future. No recent falls. The patient denies chest pain, SOB, palpitations or any other related symptoms or complaints at this time.   Past Medical History:  Diagnosis Date  . A-fib (Lindsey Cline)   . Heart murmur   . Hyperlipidemia   . Hypertension   . Kidney problem   . Macular degeneration   . Osteoporosis    osteoarthritis    Past Surgical History:  Procedure Laterality Date  . APPENDECTOMY  1942  . CATARACT EXTRACTION  1999  . INGUINAL HERNIA REPAIR  1946   Left  . TONSILLECTOMY  1928   ? Adenoids  . VAGINAL HYSTERECTOMY  1952     Current Outpatient Medications  Medication Sig Dispense Refill  . acetaminophen (TYLENOL) 325 MG tablet Take 650 mg by mouth 2 (two) times daily.    Marland Kitchen apixaban (ELIQUIS) 2.5 MG TABS tablet Take 1 tablet (2.5 mg total) by mouth 2 (two) times daily. 60 tablet 6  . calcitRIOL (ROCALTROL) 0.25  MCG capsule Take 0.25 mcg by mouth daily.     . hydroxypropyl methylcellulose / hypromellose (ISOPTO TEARS / GONIOVISC) 2.5 % ophthalmic solution 1 drop 3 (three) times daily as needed for dry eyes.    Marland Kitchen lisinopril-hydrochlorothiazide (PRINZIDE,ZESTORETIC) 20-12.5 MG per tablet TAKE 1 TABLET BY MOUTH EVERY DAY 90 tablet 1  . metoprolol tartrate (LOPRESSOR) 25 MG tablet Take 1 tablet (25 mg total) by mouth 2 (two) times daily. 60 tablet 3  . Nutritional Supplements (NUTRITIONAL SUPPLEMENT PO) Take by mouth 2 (two) times daily.    . Probiotic Product (PROBIOTIC DAILY PO) Take by mouth daily.    . Vitamin D, Ergocalciferol, (DRISDOL) 50000 units CAPS capsule Take 50,000 Units by mouth every 7 (seven) days.      No current facility-administered medications for this visit.     Allergies:   Patient has no known allergies.    Social History:  The patient  reports that she has never smoked. She has never used smokeless tobacco. She reports current alcohol use of about 2.0 standard drinks of alcohol per week. She reports that she does not use drugs.   Family History:  The patient's family history includes Cancer in her mother; Heart disease in her father.    ROS:  Please see the history of present illness.   Otherwise, review of systems are positive for none.  All other systems are reviewed and negative.    PHYSICAL EXAM: VS:  BP 124/62 (BP Location: Left Arm, Patient Position: Sitting, Cuff Size: Normal)   Pulse 76   Ht 5\' 5"  (1.651 m)   Wt 129 lb (58.5 kg)   BMI 21.47 kg/m  , BMI Body mass index is 21.47 kg/m. GEN: Well nourished, well developed, in no acute distress  HEENT: normal  Neck: no JVD, carotid bruits, or masses Cardiac: Irregularly irregular   ; no murmurs, rubs, or gallops, mild bilateral leg edema  Respiratory:  clear to auscultation bilaterally, normal work of breathing GI: soft, nontender, nondistended, + BS MS: no deformity or atrophy  Skin: warm and dry, no  rash Neuro:  Strength and sensation are intact Psych: euthymic mood, full affect   EKG:  EKG is ordered today. The ekg ordered today demonstrates atrial fibrillation with nonspecific ST changes.   Recent Labs: No results found for requested labs within last 8760 hours.    Lipid Panel    Component Value Date/Time   CHOL 233 (H) 01/03/2017 1010   TRIG 68.0 01/03/2017 1010   HDL 64.60 01/03/2017 1010   CHOLHDL 4 01/03/2017 1010   VLDL 13.6 01/03/2017 1010   LDLCALC 155 (H) 01/03/2017 1010   LDLDIRECT 128.8 05/29/2008 1251      Wt Readings from Last 3 Encounters:  11/21/18 129 lb (58.5 kg)  11/10/18 134 lb (60.8 kg)  10/27/18 122 lb 3.2 oz (55.4 kg)        ASSESSMENT AND PLAN:  1.  Chronic atrial fibrillation: Ventricular rate is well controlled on metoprolol. Continue long-term anticoagulation with low-dose Eliquis. Reviewed kidney function. Lab work in 07/2018 showed hemoglobin at 10.2 and creatine at 1.3. Both stable from before.  2. Essential hypertension: Blood pressure is reasonably controlled on current medications.  Disposition:   FU with me in 6 months  I, Margit Banda am acting as a Education administrator for Lindsey Cline, M.D.  I have reviewed the above documentation for accuracy and completeness, and I agree with the above.   Signed, Lindsey Sacramento, MD 11/21/18 [place], [place]

## 2018-11-21 ENCOUNTER — Encounter: Payer: Self-pay | Admitting: Cardiovascular Disease

## 2018-11-21 ENCOUNTER — Ambulatory Visit (INDEPENDENT_AMBULATORY_CARE_PROVIDER_SITE_OTHER): Payer: Medicare Other | Admitting: Cardiovascular Disease

## 2018-11-21 VITALS — BP 124/62 | HR 76 | Ht 65.0 in | Wt 129.0 lb

## 2018-11-21 DIAGNOSIS — I10 Essential (primary) hypertension: Secondary | ICD-10-CM | POA: Diagnosis not present

## 2018-11-21 DIAGNOSIS — I482 Chronic atrial fibrillation, unspecified: Secondary | ICD-10-CM

## 2018-11-21 NOTE — Patient Instructions (Signed)

## 2018-12-09 DIAGNOSIS — H353131 Nonexudative age-related macular degeneration, bilateral, early dry stage: Secondary | ICD-10-CM | POA: Diagnosis not present

## 2018-12-09 DIAGNOSIS — I10 Essential (primary) hypertension: Secondary | ICD-10-CM | POA: Diagnosis not present

## 2018-12-09 DIAGNOSIS — R809 Proteinuria, unspecified: Secondary | ICD-10-CM | POA: Diagnosis not present

## 2018-12-09 DIAGNOSIS — N184 Chronic kidney disease, stage 4 (severe): Secondary | ICD-10-CM | POA: Diagnosis not present

## 2018-12-16 DIAGNOSIS — N183 Chronic kidney disease, stage 3 (moderate): Secondary | ICD-10-CM | POA: Diagnosis not present

## 2018-12-16 DIAGNOSIS — N2581 Secondary hyperparathyroidism of renal origin: Secondary | ICD-10-CM | POA: Diagnosis not present

## 2018-12-16 DIAGNOSIS — I10 Essential (primary) hypertension: Secondary | ICD-10-CM | POA: Diagnosis not present

## 2018-12-16 DIAGNOSIS — R809 Proteinuria, unspecified: Secondary | ICD-10-CM | POA: Diagnosis not present

## 2019-01-30 ENCOUNTER — Encounter: Payer: Self-pay | Admitting: Internal Medicine

## 2019-01-30 ENCOUNTER — Other Ambulatory Visit: Payer: Self-pay

## 2019-01-30 ENCOUNTER — Ambulatory Visit: Payer: Medicare Other | Admitting: Internal Medicine

## 2019-01-30 VITALS — BP 126/74 | HR 95 | Temp 98.6°F | Resp 17 | Wt 128.0 lb

## 2019-01-30 DIAGNOSIS — N183 Chronic kidney disease, stage 3 unspecified: Secondary | ICD-10-CM

## 2019-01-30 DIAGNOSIS — I482 Chronic atrial fibrillation, unspecified: Secondary | ICD-10-CM | POA: Diagnosis not present

## 2019-01-30 DIAGNOSIS — M159 Polyosteoarthritis, unspecified: Secondary | ICD-10-CM

## 2019-01-30 DIAGNOSIS — I10 Essential (primary) hypertension: Secondary | ICD-10-CM

## 2019-01-30 DIAGNOSIS — R413 Other amnesia: Secondary | ICD-10-CM

## 2019-01-30 NOTE — Assessment & Plan Note (Signed)
Does fine with just tylenol Mostly in hands

## 2019-01-30 NOTE — Assessment & Plan Note (Signed)
BP Readings from Last 3 Encounters:  01/30/19 126/74  11/21/18 124/62  11/10/18 (!) 127/59   Good control No changes needed

## 2019-01-30 NOTE — Assessment & Plan Note (Signed)
Rate is fine On eliquis 

## 2019-01-30 NOTE — Progress Notes (Signed)
Subjective:    Patient ID: Lindsey Cline, female    DOB: 1919/04/10, 85 y.o.   MRN: [id number]  HPI Visit in assisted living apartment for review of chronic health conditions Reviewed status with [name] RN  She feels well overall "But I am getting old"  No heart trouble No chest pain Not walking as much with the isolation issues No palpitations No significant edema--does wear the support stockings No dizziness or syncope  Known CKD---GFR in 30s Due for labs next month  Some arthritic pain---mostly in hands Has tylenol for this  Mild memory issues No apparent progression and no functional issues Walks with rollator Independent with ADLs still  Current Outpatient Medications on File Prior to Visit  Medication Sig Dispense Refill  . acetaminophen (TYLENOL) 325 MG tablet Take 650 mg by mouth 2 (two) times daily.    Marland Kitchen apixaban (ELIQUIS) 2.5 MG TABS tablet Take 1 tablet (2.5 mg total) by mouth 2 (two) times daily. 60 tablet 6  . calcitRIOL (ROCALTROL) 0.25 MCG capsule Take 0.25 mcg by mouth daily.     . hydroxypropyl methylcellulose / hypromellose (ISOPTO TEARS / GONIOVISC) 2.5 % ophthalmic solution 1 drop 3 (three) times daily as needed for dry eyes.    Marland Kitchen lisinopril-hydrochlorothiazide (PRINZIDE,ZESTORETIC) 20-12.5 MG per tablet TAKE 1 TABLET BY MOUTH EVERY DAY 90 tablet 1  . metoprolol tartrate (LOPRESSOR) 25 MG tablet Take 1 tablet (25 mg total) by mouth 2 (two) times daily. 60 tablet 3  . Nutritional Supplements (NUTRITIONAL SUPPLEMENT PO) Take by mouth 2 (two) times daily.    . Probiotic Product (PROBIOTIC DAILY PO) Take by mouth daily.    . Vitamin D, Ergocalciferol, (DRISDOL) 50000 units CAPS capsule Take 50,000 Units by mouth every 7 (seven) days.      No current facility-administered medications on file prior to visit.     No Known Allergies  Past Medical History:  Diagnosis Date  . A-fib ([place])   . Heart murmur   . Hyperlipidemia   . Hypertension   . Kidney  problem   . Macular degeneration   . Osteoporosis    osteoarthritis    Past Surgical History:  Procedure Laterality Date  . APPENDECTOMY  1942  . CATARACT EXTRACTION  1999  . INGUINAL HERNIA REPAIR  1946   Left  . TONSILLECTOMY  1928   ? Adenoids  . VAGINAL HYSTERECTOMY  1952    Family History  Problem Relation Age of Onset  . Cancer Mother        colon  . Heart disease Father     Social History   Socioeconomic History  . Marital status: Widowed    Spouse name: Not on file  . Number of children: 1  . Years of education: Not on file  . Highest education level: Not on file  Occupational History  . Occupation: Housewife  Social Needs  . Financial resource strain: Not on file  . Food insecurity:    Worry: Not on file    Inability: Not on file  . Transportation needs:    Medical: Not on file    Non-medical: Not on file  Tobacco Use  . Smoking status: Never Smoker  . Smokeless tobacco: Never Used  Substance and Sexual Activity  . Alcohol use: Yes    Alcohol/week: 2.0 standard drinks    Types: 2 Glasses of wine per week    Comment: WINE 5 DAYS PER WEEK  . Drug use: No  . Sexual activity:  Never  Lifestyle  . Physical activity:    Days per week: Not on file    Minutes per session: Not on file  . Stress: Not on file  Relationships  . Social connections:    Talks on phone: Not on file    Gets together: Not on file    Attends religious service: Not on file    Active member of club or organization: Not on file    Attends meetings of clubs or organizations: Not on file    Relationship status: Not on file  . Intimate partner violence:    Fear of current or ex partner: Not on file    Emotionally abused: Not on file    Physically abused: Not on file    Forced sexual activity: Not on file  Other Topics Concern  . Not on file  Social History Narrative   Has a living will   Son HPOA   DNR   Review of Systems Not eating that much--I eat what I want. Weight  stable Still very HOH --aides help Sleeps okay---nocturia x 1 Bowels are okay--did have one loose stool recently (no pain or blood) No skin lesions of concern    Objective:   Physical Exam  Constitutional: She appears well-developed. No distress.  Cardiovascular: Normal rate and normal heart sounds.  No murmur heard. irregular  Respiratory: Breath sounds normal. No respiratory distress. She has no wheezes. She has no rales.  GI: There is no abdominal tenderness.  Musculoskeletal:     Comments: Trace ankle edema  Psychiatric: She has a normal mood and affect. Her behavior is normal.           Assessment & Plan:

## 2019-01-30 NOTE — Assessment & Plan Note (Signed)
Will check labs next month On rocaltrol, vitamin D and ACEI

## 2019-01-30 NOTE — Assessment & Plan Note (Signed)
Mild and no progression  No functional changes

## 2019-02-20 ENCOUNTER — Encounter: Payer: Self-pay | Admitting: Internal Medicine

## 2019-02-20 DIAGNOSIS — I10 Essential (primary) hypertension: Secondary | ICD-10-CM | POA: Diagnosis not present

## 2019-02-20 DIAGNOSIS — N183 Chronic kidney disease, stage 3 (moderate): Secondary | ICD-10-CM | POA: Diagnosis not present

## 2019-02-20 DIAGNOSIS — Z0000 Encounter for general adult medical examination without abnormal findings: Secondary | ICD-10-CM | POA: Diagnosis not present

## 2019-02-20 LAB — CBC AND DIFFERENTIAL
HCT: 33 — AB (ref 36–46)
Hemoglobin: 11.3 — AB (ref 12.0–16.0)

## 2019-02-20 LAB — BASIC METABOLIC PANEL: Creatinine: 1.3 — AB (ref 0.5–1.1)

## 2019-04-17 DIAGNOSIS — I10 Essential (primary) hypertension: Secondary | ICD-10-CM | POA: Diagnosis not present

## 2019-04-17 DIAGNOSIS — N183 Chronic kidney disease, stage 3 (moderate): Secondary | ICD-10-CM | POA: Diagnosis not present

## 2019-04-24 DIAGNOSIS — R809 Proteinuria, unspecified: Secondary | ICD-10-CM | POA: Diagnosis not present

## 2019-04-24 DIAGNOSIS — I10 Essential (primary) hypertension: Secondary | ICD-10-CM | POA: Diagnosis not present

## 2019-04-24 DIAGNOSIS — N2581 Secondary hyperparathyroidism of renal origin: Secondary | ICD-10-CM | POA: Diagnosis not present

## 2019-04-24 DIAGNOSIS — N183 Chronic kidney disease, stage 3 (moderate): Secondary | ICD-10-CM | POA: Diagnosis not present

## 2019-05-07 ENCOUNTER — Other Ambulatory Visit: Payer: Self-pay

## 2019-05-07 ENCOUNTER — Ambulatory Visit: Payer: Medicare Other | Admitting: Internal Medicine

## 2019-05-07 DIAGNOSIS — M159 Polyosteoarthritis, unspecified: Secondary | ICD-10-CM | POA: Diagnosis not present

## 2019-05-07 DIAGNOSIS — I482 Chronic atrial fibrillation, unspecified: Secondary | ICD-10-CM | POA: Diagnosis not present

## 2019-05-07 DIAGNOSIS — M810 Age-related osteoporosis without current pathological fracture: Secondary | ICD-10-CM

## 2019-05-07 DIAGNOSIS — I10 Essential (primary) hypertension: Secondary | ICD-10-CM | POA: Diagnosis not present

## 2019-05-08 ENCOUNTER — Encounter: Payer: Self-pay | Admitting: Internal Medicine

## 2019-05-08 NOTE — Progress Notes (Signed)
Subjective:    Patient ID: [facility] Cline, female    DOB: November 04, 1918, 85 y.o.   MRN: [id number]  HPI  Resident seen in [address] for routine follow up Reviewed with RN, no new concerns Resident sleeps well. She gets assistance in the mornings with bathing and dressing. She walks with a walker.  Her appetite is good per her report but she has lost a little weight Her bowels are okay, she denies urinary incontince She denies pain, reflux or SOB  Osteoporosis: She is still ambulatory with use of walker. She is taking Vit D and Calcium.  Afib: Persistent. No issues on Metoprolol and Eliquis.  HTN: Controlled on Lisnopril HCT and Metoprolol. No dizziness or orthostasis.  OA: Mainly in her knees. Controlled on Tylenol.  Review of Systems  Past Medical History:  Diagnosis Date  . A-fib ([place])   . Heart murmur   . Hyperlipidemia   . Hypertension   . Kidney problem   . Macular degeneration   . Osteoporosis    osteoarthritis    Current Outpatient Medications  Medication Sig Dispense Refill  . acetaminophen (TYLENOL) 325 MG tablet Take 650 mg by mouth 2 (two) times daily.    Marland Kitchen apixaban (ELIQUIS) 2.5 MG TABS tablet Take 1 tablet (2.5 mg total) by mouth 2 (two) times daily. 60 tablet 6  . calcitRIOL (ROCALTROL) 0.25 MCG capsule Take 0.25 mcg by mouth daily.     . hydroxypropyl methylcellulose / hypromellose (ISOPTO TEARS / GONIOVISC) 2.5 % ophthalmic solution 1 drop 3 (three) times daily as needed for dry eyes.    Marland Kitchen lisinopril-hydrochlorothiazide (PRINZIDE,ZESTORETIC) 20-12.5 MG per tablet TAKE 1 TABLET BY MOUTH EVERY DAY 90 tablet 1  . metoprolol tartrate (LOPRESSOR) 25 MG tablet Take 1 tablet (25 mg total) by mouth 2 (two) times daily. 60 tablet 3  . Nutritional Supplements (NUTRITIONAL SUPPLEMENT PO) Take by mouth 2 (two) times daily.    . Probiotic Product (PROBIOTIC DAILY PO) Take by mouth daily.    . Vitamin D, Ergocalciferol, (DRISDOL) 50000 units CAPS capsule Take 50,000 Units  by mouth every 7 (seven) days.      No current facility-administered medications for this visit.     No Known Allergies  Family History  Problem Relation Age of Onset  . Cancer Mother        colon  . Heart disease Father     Social History   Socioeconomic History  . Marital status: Widowed    Spouse name: Not on file  . Number of children: 1  . Years of education: Not on file  . Highest education level: Not on file  Occupational History  . Occupation: Housewife  Social Needs  . Financial resource strain: Not on file  . Food insecurity    Worry: Not on file    Inability: Not on file  . Transportation needs    Medical: Not on file    Non-medical: Not on file  Tobacco Use  . Smoking status: Never Smoker  . Smokeless tobacco: Never Used  Substance and Sexual Activity  . Alcohol use: Yes    Alcohol/week: 2.0 standard drinks    Types: 2 Glasses of wine per week    Comment: WINE 5 DAYS PER WEEK  . Drug use: No  . Sexual activity: Never  Lifestyle  . Physical activity    Days per week: Not on file    Minutes per session: Not on file  . Stress: Not on file  Relationships  . Social Herbalist on phone: Not on file    Gets together: Not on file    Attends religious service: Not on file    Active member of club or organization: Not on file    Attends meetings of clubs or organizations: Not on file    Relationship status: Not on file  . Intimate partner violence    Fear of current or ex partner: Not on file    Emotionally abused: Not on file    Physically abused: Not on file    Forced sexual activity: Not on file  Other Topics Concern  . Not on file  Social History Narrative   Has a living will   Son HPOA   DNR     Constitutional: Denies fever, malaise, fatigue, headache or abrupt weight changes.  HEENT: Pt reports trouble hearing. Denies eye pain, eye redness, ear pain, ringing in the ears, wax buildup, runny nose, nasal congestion, bloody nose, or  sore throat. Respiratory: Denies difficulty breathing, shortness of breath, cough or sputum production.   Cardiovascular: Denies chest pain, chest tightness, palpitations or swelling in the hands or feet.  Gastrointestinal: Denies abdominal pain, bloating, constipation, diarrhea or blood in the stool.  GU: Denies urgency, frequency, pain with urination, burning sensation, blood in urine, odor or discharge. Musculoskeletal: Pt reports intermittent knee pain. Denies decrease in range of motion, difficulty with gait, muscle pain or joint swelling.  Skin: Denies redness, rashes, lesions or ulcercations.  Neurological: Denies dizziness, difficulty with memory, difficulty with speech.  Psych: Denies anxiety, depression, SI/HI.  No other specific complaints in a complete review of systems (except as listed in HPI above).     Objective:   Physical Exam   BP 118/64   Pulse 72   Resp 18  Wt Readings from Last 3 Encounters:  01/30/19 128 lb (58.1 kg)  11/21/18 129 lb (58.5 kg)  11/10/18 134 lb (60.8 kg)    General: Appears her stated age, in NAD. Neck: No nodes or JVD Cardiovascular: Irregular. No murmur. Trace edema noted. Pulmonary/Chest: Normal effort and positive vesicular breath sounds. No respiratory distress. No wheezes, rales or ronchi noted.  Abdomen: Soft and nontender. Normal bowel sounds.  Musculoskeletal: Gait slow and steady with use of walker. Neurological: Alert and oriented. Garbled speech. Psychiatric: Mood and affect normal. Behavior is normal. Judgment and thought content normal.     BMET    Component Value Date/Time   NA 140 01/03/2017 1010   NA 143 05/14/2012 0546   K 4.3 01/03/2017 1010   K 4.2 05/14/2012 0546   CL 103 01/03/2017 1010   CL 111 (H) 05/14/2012 0546   CO2 30 01/03/2017 1010   CO2 25 05/14/2012 0546   GLUCOSE 108 (H) 01/03/2017 1010   GLUCOSE 92 05/14/2012 0546   BUN 30 (H) 01/03/2017 1010   BUN 39 (H) 05/14/2012 0546   CREATININE 1.40 (H)  01/03/2017 1010   CREATININE 1.26 05/14/2012 0546   CALCIUM 9.0 01/03/2017 1010   CALCIUM 8.0 (L) 05/14/2012 0546   GFRNONAA 37 (L) 05/14/2012 0546   GFRAA 43 (L) 05/14/2012 0546    Lipid Panel     Component Value Date/Time   CHOL 233 (H) 01/03/2017 1010   TRIG 68.0 01/03/2017 1010   HDL 64.60 01/03/2017 1010   CHOLHDL 4 01/03/2017 1010   VLDL 13.6 01/03/2017 1010   LDLCALC 155 (H) 01/03/2017 1010    CBC    Component Value  Date/Time   WBC 6.0 03/22/2015 1026   RBC 3.75 (L) 03/22/2015 1026   HGB 12.2 03/22/2015 1026   HGB 8.4 (L) 05/16/2012 0429   HCT 37.3 03/22/2015 1026   HCT 24.2 (L) 05/16/2012 0429   PLT 254.0 03/22/2015 1026   PLT 143 (L) 05/16/2012 0429   MCV 99.6 03/22/2015 1026   MCV 99 05/16/2012 0429   MCH 34.4 (H) 05/16/2012 0429   MCHC 32.7 03/22/2015 1026   RDW 14.7 03/22/2015 1026   RDW 15.0 (H) 05/16/2012 0429   LYMPHSABS 1.5 03/22/2015 1026   LYMPHSABS 1.7 05/16/2012 0429   MONOABS 0.5 03/22/2015 1026   MONOABS 0.5 05/16/2012 0429   EOSABS 0.1 03/22/2015 1026   EOSABS 0.2 05/16/2012 0429   BASOSABS 0.0 03/22/2015 1026   BASOSABS 0.1 05/16/2012 0429    Hgb A1C No results found for: HGBA1C         Assessment & Plan:

## 2019-05-08 NOTE — Assessment & Plan Note (Signed)
Controlled on Lisinopril HCT and Metoprolol Will monitor

## 2019-05-08 NOTE — Assessment & Plan Note (Signed)
Continue Metoprolol and Eliquis 

## 2019-05-08 NOTE — Assessment & Plan Note (Signed)
Continue Calcium, Vit D and weight bearing

## 2019-05-08 NOTE — Assessment & Plan Note (Signed)
Continue Tylenol Encouraged regular ambulation

## 2019-05-27 DIAGNOSIS — E538 Deficiency of other specified B group vitamins: Secondary | ICD-10-CM | POA: Diagnosis not present

## 2019-05-27 DIAGNOSIS — G3184 Mild cognitive impairment, so stated: Secondary | ICD-10-CM | POA: Diagnosis not present

## 2019-06-03 ENCOUNTER — Other Ambulatory Visit: Payer: Self-pay

## 2019-06-03 ENCOUNTER — Telehealth (INDEPENDENT_AMBULATORY_CARE_PROVIDER_SITE_OTHER): Payer: Medicare Other | Admitting: Cardiovascular Disease

## 2019-06-03 ENCOUNTER — Encounter: Payer: Self-pay | Admitting: Cardiovascular Disease

## 2019-06-03 VITALS — BP 108/53 | HR 102 | Ht 64.0 in | Wt 119.0 lb

## 2019-06-03 DIAGNOSIS — I10 Essential (primary) hypertension: Secondary | ICD-10-CM | POA: Diagnosis not present

## 2019-06-03 DIAGNOSIS — I482 Chronic atrial fibrillation, unspecified: Secondary | ICD-10-CM | POA: Diagnosis not present

## 2019-06-03 NOTE — Patient Instructions (Signed)
Medication Instructions:  Continue same medications If you need a refill on your cardiac medications before your next appointment, please call your pharmacy.   Lab work: None If you have labs (blood work) drawn today and your tests are completely normal, you will receive your results only by: Marland Kitchen MyChart Message (if you have MyChart) OR . A paper copy in the mail If you have any lab test that is abnormal or we need to change your treatment, we will call you to review the results.  Testing/Procedures: None  Follow-Up: At [facility], you and your health needs are our priority.  As part of our continuing mission to provide you with exceptional heart care, we have created designated Provider Care Teams.  These Care Teams include your primary Cardiologist (physician) and Advanced Practice Providers (APPs -  Physician Assistants and Nurse Practitioners) who all work together to provide you with the care you need, when you need it. You will need a follow up appointment in 4 months.  Please call our office 2 months in advance to schedule this appointment.  You may see No primary care provider on file. or one of the following Advanced Practice Providers on your designated Care Team:   Murray Hodgkins, NP Christell Faith, PA-C Marrianne Mood, [place]

## 2019-06-03 NOTE — Progress Notes (Signed)
Virtual Visit via Video Note   This visit type was conducted due to national recommendations for restrictions regarding the COVID-19 Pandemic (e.g. social distancing) in an effort to limit this patient's exposure and mitigate transmission in our community.  Due to her co-morbid illnesses, this patient is at least at moderate risk for complications without adequate follow up.  This format is felt to be most appropriate for this patient at this time.  All issues noted in this document were discussed and addressed.  A limited physical exam was performed with this format.  Please refer to the patient's chart for her consent to telehealth for [facility]   Date:  06/03/2019   ID:  Lindsey Cline, DOB 10/04/1919, MRN [id number]  Patient Location: Home Provider Location: Home  PCP:  Venia Carbon, MD  Cardiologist:  No primary care provider on file.  Electrophysiologist:  None   Evaluation Performed:  Follow-Up Visit  Chief Complaint: No complaints today.  History of Present Illness:    Lindsey Cline is a 85 y.o. female who was seen via video visit for follow-up regarding chronic atrial fibrillation.  Echocardiogram in April 2016 showed normal LV systolic function, moderate mitral regurgitation, severely dilated left atrium and mildly dilated right atrium.  She has been treated with rate control with metoprolol and anticoagulation with low dose Eliquis . She lives in [facility] assisted living facility.  She is very hard of hearing.  She has been doing reasonably well with no recent chest pain or worsening dyspnea.  No palpitations.  No bleeding complications with low-dose Eliquis.  The patient does not have symptoms concerning for COVID-19 infection (fever, chills, cough, or new shortness of breath).    Past Medical History:  Diagnosis Date  . A-fib ([place])   . Heart murmur   . Hyperlipidemia   . Hypertension   . Kidney problem   . Macular degeneration   . Osteoporosis     osteoarthritis   Past Surgical History:  Procedure Laterality Date  . APPENDECTOMY  1942  . CATARACT EXTRACTION  1999  . INGUINAL HERNIA REPAIR  1946   Left  . TONSILLECTOMY  1928   ? Adenoids  . VAGINAL HYSTERECTOMY  1952     Current Meds  Medication Sig  . acetaminophen (TYLENOL) 325 MG tablet Take 650 mg by mouth 2 (two) times daily.  Marland Kitchen apixaban (ELIQUIS) 2.5 MG TABS tablet Take 1 tablet (2.5 mg total) by mouth 2 (two) times daily.  . hydroxypropyl methylcellulose / hypromellose (ISOPTO TEARS / GONIOVISC) 2.5 % ophthalmic solution 1 drop 3 (three) times daily as needed for dry eyes.  Marland Kitchen lisinopril-hydrochlorothiazide (PRINZIDE,ZESTORETIC) 20-12.5 MG per tablet TAKE 1 TABLET BY MOUTH EVERY DAY  . metoprolol tartrate (LOPRESSOR) 25 MG tablet Take 1 tablet (25 mg total) by mouth 2 (two) times daily.  . mirtazapine (REMERON) 15 MG tablet Take by mouth daily.  . Nutritional Supplements (NUTRITIONAL SUPPLEMENT PO) Take by mouth 2 (two) times daily.  . Probiotic Product (PROBIOTIC DAILY PO) Take by mouth daily.  . Vitamin D, Ergocalciferol, (DRISDOL) 50000 units CAPS capsule Take 50,000 Units by mouth every 14 (fourteen) days.      Allergies:   Patient has no known allergies.   Social History   Tobacco Use  . Smoking status: Never Smoker  . Smokeless tobacco: Never Used  Substance Use Topics  . Alcohol use: Yes    Alcohol/week: 2.0 standard drinks    Types: 2 Glasses of wine  per week    Comment: WINE 5 DAYS PER WEEK  . Drug use: No     Family Hx: The patient's family history includes Cancer in her mother; Heart disease in her father.  ROS:   Please see the history of present illness.     All other systems reviewed and are negative.   Prior CV studies:   The following studies were reviewed today:    Labs/Other Tests and Data Reviewed:    EKG:  No ECG reviewed.  Recent Labs: No results found for requested labs within last 8760 hours.   Recent Lipid Panel Lab  Results  Component Value Date/Time   CHOL 233 (H) 01/03/2017 10:10 AM   TRIG 68.0 01/03/2017 10:10 AM   HDL 64.60 01/03/2017 10:10 AM   CHOLHDL 4 01/03/2017 10:10 AM   LDLCALC 155 (H) 01/03/2017 10:10 AM   LDLDIRECT 128.8 05/29/2008 12:51 PM    Wt Readings from Last 3 Encounters:  06/03/19 119 lb (54 kg)  01/30/19 128 lb (58.1 kg)  11/21/18 129 lb (58.5 kg)     Objective:    Vital Signs:  BP (!) 108/53 (BP Location: Left Arm, Patient Position: Sitting, Cuff Size: Normal)   Pulse (!) 102   Ht 5\' 4"  (1.626 m)   Wt 119 lb (54 kg)   BMI 20.43 kg/m    VITAL SIGNS:  reviewed GEN:  no acute distress EYES:  sclerae anicteric, EOMI - Extraocular Movements Intact RESPIRATORY:  normal respiratory effort, symmetric expansion SKIN:  no rash, lesions or ulcers. MUSCULOSKELETAL:  no obvious deformities. NEURO:  alert and oriented x 3, no obvious focal deficit PSYCH:  Very hard of hearing with flat affect.  ASSESSMENT & PLAN:    1.  Chronic atrial fibrillation: Ventricular rate is somewhat on the high side.  She appears to be asymptomatic.  We should consider increasing metoprolol in the future and decreasing lisinopril-hydrochlorothiazide.  Recent labs reviewed and showed stable mild anemia.  2. Essential hypertension: Blood pressure has been somewhat on the low side.  We should consider decreasing lisinopril-hydrochlorothiazide or stopping it.  COVID-19 Education: The signs and symptoms of COVID-19 were discussed with the patient and how to seek care for testing (follow up with PCP or arrange E-visit).  The importance of social distancing was discussed today.  Time:   Today, I have spent 6 minutes with the patient with telehealth technology discussing the above problems.     Medication Adjustments/Labs and Tests Ordered: Current medicines are reviewed at length with the patient today.  Concerns regarding medicines are outlined above.   Tests Ordered: No orders of the defined  types were placed in this encounter.   Medication Changes: No orders of the defined types were placed in this encounter.   Follow Up:  In Person in 4 month(s)  Signed, Kathlyn Sacramento, MD  06/03/2019 2:56 PM    [place]

## 2019-06-04 ENCOUNTER — Telehealth: Payer: Self-pay | Admitting: Cardiovascular Disease

## 2019-06-04 NOTE — Telephone Encounter (Signed)
Pt c/o medication issue:  1. Name of Medication: lasix  2. How are you currently taking this medication (dosage and times per day)? Not taking rn asking for rx of 20 mg po 2 x week prn for BLE edema weeping with recent skin tear but patient has kidney issues   3. Are you having a reaction (difficulty breathing--STAT)? no  4. What is your medication issue?  rn asking for rx of 20 mg po 2 x week prn for BLE edema weeping with recent skin tear but patient has kidney issues   If ordered please fax to [facility] at [phone]

## 2019-06-04 NOTE — Telephone Encounter (Signed)
Lasix 20 mg daily as needed is fine for worsening edema or more than 3 pounds weight gain in 48 hours.

## 2019-06-05 NOTE — Telephone Encounter (Signed)
Order for prn Lasix 20mg  daily faxed to [facility] to the fax # provided below.

## 2019-06-05 NOTE — Telephone Encounter (Signed)
lmom for [name], RN @ [facility]. Called to confirm that they received the fax for the patient's prn lasix.

## 2019-06-15 ENCOUNTER — Emergency Department: Payer: Medicare Other

## 2019-06-15 ENCOUNTER — Other Ambulatory Visit: Payer: Self-pay

## 2019-06-15 ENCOUNTER — Inpatient Hospital Stay
Admission: EM | Admit: 2019-06-15 | Discharge: 2019-06-19 | DRG: 602 | Disposition: A | Discharge status: Skilled Nursing Facility | Payer: Medicare Other | Source: Skilled Nursing Facility | Attending: Internal Medicine | Admitting: Internal Medicine

## 2019-06-15 DIAGNOSIS — F0391 Unspecified dementia with behavioral disturbance: Secondary | ICD-10-CM | POA: Diagnosis not present

## 2019-06-15 DIAGNOSIS — N183 Chronic kidney disease, stage 3 unspecified: Secondary | ICD-10-CM | POA: Diagnosis present

## 2019-06-15 DIAGNOSIS — R413 Other amnesia: Secondary | ICD-10-CM | POA: Diagnosis present

## 2019-06-15 DIAGNOSIS — Z90710 Acquired absence of both cervix and uterus: Secondary | ICD-10-CM

## 2019-06-15 DIAGNOSIS — R609 Edema, unspecified: Secondary | ICD-10-CM | POA: Diagnosis not present

## 2019-06-15 DIAGNOSIS — Z20828 Contact with and (suspected) exposure to other viral communicable diseases: Secondary | ICD-10-CM | POA: Diagnosis not present

## 2019-06-15 DIAGNOSIS — R0689 Other abnormalities of breathing: Secondary | ICD-10-CM | POA: Diagnosis not present

## 2019-06-15 DIAGNOSIS — M810 Age-related osteoporosis without current pathological fracture: Secondary | ICD-10-CM | POA: Diagnosis present

## 2019-06-15 DIAGNOSIS — E785 Hyperlipidemia, unspecified: Secondary | ICD-10-CM | POA: Diagnosis present

## 2019-06-15 DIAGNOSIS — N179 Acute kidney failure, unspecified: Secondary | ICD-10-CM | POA: Diagnosis not present

## 2019-06-15 DIAGNOSIS — E876 Hypokalemia: Secondary | ICD-10-CM | POA: Diagnosis present

## 2019-06-15 DIAGNOSIS — L03115 Cellulitis of right lower limb: Principal | ICD-10-CM | POA: Diagnosis present

## 2019-06-15 DIAGNOSIS — Z7901 Long term (current) use of anticoagulants: Secondary | ICD-10-CM

## 2019-06-15 DIAGNOSIS — L0390 Cellulitis, unspecified: Secondary | ICD-10-CM

## 2019-06-15 DIAGNOSIS — Z8249 Family history of ischemic heart disease and other diseases of the circulatory system: Secondary | ICD-10-CM

## 2019-06-15 DIAGNOSIS — I482 Chronic atrial fibrillation, unspecified: Secondary | ICD-10-CM | POA: Diagnosis present

## 2019-06-15 DIAGNOSIS — Z9114 Patient's other noncompliance with medication regimen: Secondary | ICD-10-CM

## 2019-06-15 DIAGNOSIS — R4182 Altered mental status, unspecified: Secondary | ICD-10-CM | POA: Diagnosis not present

## 2019-06-15 DIAGNOSIS — D631 Anemia in chronic kidney disease: Secondary | ICD-10-CM | POA: Diagnosis present

## 2019-06-15 DIAGNOSIS — H3530 Unspecified macular degeneration: Secondary | ICD-10-CM | POA: Diagnosis present

## 2019-06-15 DIAGNOSIS — Z66 Do not resuscitate: Secondary | ICD-10-CM | POA: Diagnosis present

## 2019-06-15 DIAGNOSIS — L8990 Pressure ulcer of unspecified site, unspecified stage: Secondary | ICD-10-CM | POA: Insufficient documentation

## 2019-06-15 DIAGNOSIS — R5381 Other malaise: Secondary | ICD-10-CM | POA: Diagnosis not present

## 2019-06-15 DIAGNOSIS — Z79899 Other long term (current) drug therapy: Secondary | ICD-10-CM

## 2019-06-15 DIAGNOSIS — G9341 Metabolic encephalopathy: Secondary | ICD-10-CM | POA: Diagnosis present

## 2019-06-15 DIAGNOSIS — I10 Essential (primary) hypertension: Secondary | ICD-10-CM | POA: Diagnosis present

## 2019-06-15 DIAGNOSIS — Z03818 Encounter for observation for suspected exposure to other biological agents ruled out: Secondary | ICD-10-CM | POA: Diagnosis not present

## 2019-06-15 DIAGNOSIS — I129 Hypertensive chronic kidney disease with stage 1 through stage 4 chronic kidney disease, or unspecified chronic kidney disease: Secondary | ICD-10-CM | POA: Diagnosis present

## 2019-06-15 DIAGNOSIS — R456 Violent behavior: Secondary | ICD-10-CM | POA: Diagnosis not present

## 2019-06-15 LAB — CBC WITH DIFFERENTIAL/PLATELET
Abs Immature Granulocytes: 0.06 10*3/uL (ref 0.00–0.07)
Basophils Absolute: 0 10*3/uL (ref 0.0–0.1)
Basophils Relative: 0 %
Eosinophils Absolute: 0 10*3/uL (ref 0.0–0.5)
Eosinophils Relative: 0 %
HCT: 29 % — ABNORMAL LOW (ref 36.0–46.0)
Hemoglobin: 9.2 g/dL — ABNORMAL LOW (ref 12.0–15.0)
Immature Granulocytes: 1 %
Lymphocytes Relative: 11 %
Lymphs Abs: 0.9 10*3/uL (ref 0.7–4.0)
MCH: 33.6 pg (ref 26.0–34.0)
MCHC: 31.7 g/dL (ref 30.0–36.0)
MCV: 105.8 fL — ABNORMAL HIGH (ref 80.0–100.0)
Monocytes Absolute: 0.7 10*3/uL (ref 0.1–1.0)
Monocytes Relative: 8 %
Neutro Abs: 6.7 10*3/uL (ref 1.7–7.7)
Neutrophils Relative %: 80 %
Platelets: 360 10*3/uL (ref 150–400)
RBC: 2.74 MIL/uL — ABNORMAL LOW (ref 3.87–5.11)
RDW: 16.9 % — ABNORMAL HIGH (ref 11.5–15.5)
WBC: 8.5 10*3/uL (ref 4.0–10.5)
nRBC: 0 % (ref 0.0–0.2)

## 2019-06-15 LAB — URINALYSIS, COMPLETE (UACMP) WITH MICROSCOPIC
Bacteria, UA: NONE SEEN
Bilirubin Urine: NEGATIVE
Glucose, UA: NEGATIVE mg/dL
Hgb urine dipstick: NEGATIVE
Ketones, ur: NEGATIVE mg/dL
Leukocytes,Ua: NEGATIVE
Nitrite: NEGATIVE
Protein, ur: NEGATIVE mg/dL
Specific Gravity, Urine: 1.018 (ref 1.005–1.030)
pH: 5 (ref 5.0–8.0)

## 2019-06-15 LAB — BASIC METABOLIC PANEL
Anion gap: 12 (ref 5–15)
BUN: 42 mg/dL — ABNORMAL HIGH (ref 8–23)
CO2: 29 mmol/L (ref 22–32)
Calcium: 8.9 mg/dL (ref 8.9–10.3)
Chloride: 100 mmol/L (ref 98–111)
Creatinine, Ser: 1.51 mg/dL — ABNORMAL HIGH (ref 0.44–1.00)
GFR calc Af Amer: 33 mL/min — ABNORMAL LOW (ref >60)
GFR calc non Af Amer: 29 mL/min — ABNORMAL LOW (ref >60)
Glucose, Bld: 95 mg/dL (ref 70–99)
Potassium: 3.9 mmol/L (ref 3.5–5.1)
Sodium: 141 mmol/L (ref 135–145)

## 2019-06-15 LAB — TROPONIN I (HIGH SENSITIVITY): Troponin I (High Sensitivity): 39 ng/L — ABNORMAL HIGH (ref <18)

## 2019-06-15 MED ORDER — SODIUM CHLORIDE 0.9 % IV BOLUS
1000.0000 mL | Freq: Once | INTRAVENOUS | Status: AC
  Administered 2019-06-15: 1000 mL via INTRAVENOUS

## 2019-06-15 MED ORDER — LORAZEPAM 2 MG/ML IJ SOLN
2.0000 mg | Freq: Once | INTRAMUSCULAR | Status: DC
  Filled 2019-06-15: qty 1

## 2019-06-15 MED ORDER — HALOPERIDOL LACTATE 5 MG/ML IJ SOLN
5.0000 mg | Freq: Once | INTRAMUSCULAR | Status: AC
  Administered 2019-06-15: 21:00:00 5 mg via INTRAMUSCULAR
  Filled 2019-06-15: qty 1

## 2019-06-15 MED ORDER — DIPHENHYDRAMINE HCL 50 MG/ML IJ SOLN
25.0000 mg | Freq: Once | INTRAMUSCULAR | Status: DC
  Filled 2019-06-15: qty 1
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
## 2019-06-15 NOTE — ED Notes (Signed)
Dr Archie Balboa notified of soft pressure. Received verbal order for 1021mL NS bolus.

## 2019-06-15 NOTE — ED Notes (Signed)
Called patients son Jeneen Rinks to give status update. He stated that she is normally oriented to self and place but he noticed that she has been more confused over the last several days. He asked for status updates of there are any significant changes during the night.

## 2019-06-15 NOTE — ED Provider Notes (Signed)
[facility] Emergency Department Provider Note   ____________________________________________   I have reviewed the triage vital signs and the nursing notes.   HISTORY  Chief Complaint Altered Mental Status  History limited by: Altered Mental Status   HPI Lindsey Cline is a 85 y.o. female who presents to the emergency department today because of concern for altered mental status. Apparently the patient has been more confused over the past 2 days. Lives in assisted living facility. Apparently staff who visit her think that she has not been taking her medication over the past couple of days. The patient is unable to give any details as to what has been happening over the past couple of days.   Records reviewed. Per medical record review patient has a history of HLD, HTN, a-fib, memory loss.   Past Medical History:  Diagnosis Date  . A-fib ([place])   . Heart murmur   . Hyperlipidemia   . Hypertension   . Kidney problem   . Macular degeneration   . Osteoporosis    osteoarthritis    Patient Active Problem List   Diagnosis Date Noted  . Osteoporosis 05/15/2018  . Chronic atrial fibrillation 04/01/2015  . Memory loss 03/22/2015  . Mitral regurgitation 01/29/2015  . DNR (do not resuscitate) 12/28/2014  . CKD (chronic kidney disease), stage III ([place]) 12/28/2014  . Hearing loss 12/25/2013  . ESSENTIAL HYPERTENSION, BENIGN 09/14/2010  . Macular degeneration (senile) of retina 05/29/2008  . Osteoarthritis 05/29/2008    Past Surgical History:  Procedure Laterality Date  . APPENDECTOMY  1942  . CATARACT EXTRACTION  1999  . INGUINAL HERNIA REPAIR  1946   Left  . TONSILLECTOMY  1928   ? Adenoids  . VAGINAL HYSTERECTOMY  1952    Prior to Admission medications   Medication Sig Start Date End Date Taking? Authorizing Provider  acetaminophen (TYLENOL) 325 MG tablet Take 650 mg by mouth 2 (two) times daily.    [provider]  apixaban (ELIQUIS)  2.5 MG TABS tablet Take 1 tablet (2.5 mg total) by mouth 2 (two) times daily. 12/29/14   Wellington Hampshire, MD  hydroxypropyl methylcellulose / hypromellose (ISOPTO TEARS / GONIOVISC) 2.5 % ophthalmic solution 1 drop 3 (three) times daily as needed for dry eyes.    [provider]  lisinopril-hydrochlorothiazide (PRINZIDE,ZESTORETIC) 20-12.5 MG per tablet TAKE 1 TABLET BY MOUTH EVERY DAY 07/05/15   Lucille Passy, MD  metoprolol tartrate (LOPRESSOR) 25 MG tablet Take 1 tablet (25 mg total) by mouth 2 (two) times daily. 01/29/15   Wellington Hampshire, MD  mirtazapine (REMERON) 15 MG tablet Take by mouth daily. 05/27/19 05/26/20  [provider]  Nutritional Supplements (NUTRITIONAL SUPPLEMENT PO) Take by mouth 2 (two) times daily.    [provider]  Probiotic Product (PROBIOTIC DAILY PO) Take by mouth daily.    [provider]  Vitamin D, Ergocalciferol, (DRISDOL) 50000 units CAPS capsule Take 50,000 Units by mouth every 14 (fourteen) days.     [provider]    Allergies Patient has no known allergies.  Family History  Problem Relation Age of Onset  . Cancer Mother        colon  . Heart disease Father     Social History Social History   Tobacco Use  . Smoking status: Never Smoker  . Smokeless tobacco: Never Used  Substance Use Topics  . Alcohol use: Yes    Alcohol/week: 2.0 standard drinks    Types: 2  Glasses of wine per week    Comment: WINE 5 DAYS PER WEEK  . Drug use: No    Review of Systems Unable to obtain reliable ROS secondary to AMS.  ____________________________________________   PHYSICAL EXAM:  VITAL SIGNS: ED Triage Vitals  Enc Vitals Group     BP 06/15/19 2046 (!) 120/57     Pulse Rate 06/15/19 2046 96     Resp 06/15/19 2046 18     Temp 06/15/19 2051 98.9 F (37.2 C)     Temp Source 06/15/19 2051 Axillary     SpO2 06/15/19 2046 97 %     Weight 06/15/19 2048 110 lb (49.9 kg)     Height 06/15/19 2048 5\' 4"  (1.626 m)    Constitutional: Awake and alert. Not answering questions.  Eyes: Conjunctivae are normal.  ENT      Head: Normocephalic and atraumatic.      Nose: No congestion/rhinnorhea.      Mouth/Throat: Mucous membranes are moist.      Neck: No stridor. Hematological/Lymphatic/Immunilogical: No cervical lymphadenopathy. Cardiovascular: Normal rate, regular rhythm.  No murmurs, rubs, or gallops.  Respiratory: Normal respiratory effort without tachypnea nor retractions. Breath sounds are clear and equal bilaterally. No wheezes/rales/rhonchi. Gastrointestinal: Soft and non tender. No rebound. No guarding.  Genitourinary: Deferred Musculoskeletal: Normal range of motion in all extremities. No lower extremity edema. Neurologic:  Awake and alert. Not oriented.  Skin:  Skin is warm, dry and intact. No rash noted.  ____________________________________________    LABS (pertinent positives/negatives)  Trop hs 39 BMP wnl except bun 42, cr 1.51 UA hazy, 0-5 rbc and wbc CBC wbc 8.5, hgb 9.2, plt 360  ____________________________________________    RADIOLOGY  CT Head No acute abnormality ____________________________________________   PROCEDURES  Procedures  ____________________________________________   INITIAL IMPRESSION / ASSESSMENT AND PLAN / ED COURSE  Pertinent labs & imaging results that were available during my care of the patient were reviewed by me and considered in my medical decision making (see chart for details).   Patient presented to the emergency department today because of concerns for altered mental status.  On exam patient is awake however cannot give any history.  She is not answering questions.  Differential would be broad including a cranial process, infection, electrolyte abnormality amongst other etiologies.  Initial work-up here in the emergency department without revealing etiology of the patient's symptoms.  Will however plan on admission for further work-up and  evaluation.  ____________________________________________   FINAL CLINICAL IMPRESSION(S) / ED DIAGNOSES  Final diagnoses:  Altered mental status, unspecified altered mental status type  Cellulitis, unspecified cellulitis site     Note: This dictation was prepared with Dragon dictation. Any transcriptional errors that result from this process are unintentional     Nance Pear, MD 06/16/19 1752

## 2019-06-16 ENCOUNTER — Other Ambulatory Visit: Payer: Self-pay

## 2019-06-16 DIAGNOSIS — N183 Chronic kidney disease, stage 3 (moderate): Secondary | ICD-10-CM | POA: Diagnosis present

## 2019-06-16 DIAGNOSIS — I10 Essential (primary) hypertension: Secondary | ICD-10-CM | POA: Diagnosis not present

## 2019-06-16 DIAGNOSIS — Z90710 Acquired absence of both cervix and uterus: Secondary | ICD-10-CM | POA: Diagnosis not present

## 2019-06-16 DIAGNOSIS — M810 Age-related osteoporosis without current pathological fracture: Secondary | ICD-10-CM | POA: Diagnosis present

## 2019-06-16 DIAGNOSIS — R4182 Altered mental status, unspecified: Secondary | ICD-10-CM | POA: Diagnosis not present

## 2019-06-16 DIAGNOSIS — R262 Difficulty in walking, not elsewhere classified: Secondary | ICD-10-CM | POA: Diagnosis not present

## 2019-06-16 DIAGNOSIS — I129 Hypertensive chronic kidney disease with stage 1 through stage 4 chronic kidney disease, or unspecified chronic kidney disease: Secondary | ICD-10-CM | POA: Diagnosis present

## 2019-06-16 DIAGNOSIS — F0391 Unspecified dementia with behavioral disturbance: Secondary | ICD-10-CM | POA: Diagnosis present

## 2019-06-16 DIAGNOSIS — R4189 Other symptoms and signs involving cognitive functions and awareness: Secondary | ICD-10-CM | POA: Diagnosis not present

## 2019-06-16 DIAGNOSIS — Z741 Need for assistance with personal care: Secondary | ICD-10-CM | POA: Diagnosis not present

## 2019-06-16 DIAGNOSIS — Z7401 Bed confinement status: Secondary | ICD-10-CM | POA: Diagnosis not present

## 2019-06-16 DIAGNOSIS — R413 Other amnesia: Secondary | ICD-10-CM | POA: Diagnosis present

## 2019-06-16 DIAGNOSIS — Z79899 Other long term (current) drug therapy: Secondary | ICD-10-CM | POA: Diagnosis not present

## 2019-06-16 DIAGNOSIS — E876 Hypokalemia: Secondary | ICD-10-CM | POA: Diagnosis present

## 2019-06-16 DIAGNOSIS — Z8249 Family history of ischemic heart disease and other diseases of the circulatory system: Secondary | ICD-10-CM | POA: Diagnosis not present

## 2019-06-16 DIAGNOSIS — G9341 Metabolic encephalopathy: Secondary | ICD-10-CM | POA: Diagnosis present

## 2019-06-16 DIAGNOSIS — G9340 Encephalopathy, unspecified: Secondary | ICD-10-CM | POA: Diagnosis not present

## 2019-06-16 DIAGNOSIS — M6281 Muscle weakness (generalized): Secondary | ICD-10-CM | POA: Diagnosis not present

## 2019-06-16 DIAGNOSIS — Z9114 Patient's other noncompliance with medication regimen: Secondary | ICD-10-CM | POA: Diagnosis not present

## 2019-06-16 DIAGNOSIS — I482 Chronic atrial fibrillation, unspecified: Secondary | ICD-10-CM | POA: Diagnosis present

## 2019-06-16 DIAGNOSIS — R404 Transient alteration of awareness: Secondary | ICD-10-CM | POA: Diagnosis not present

## 2019-06-16 DIAGNOSIS — I959 Hypotension, unspecified: Secondary | ICD-10-CM | POA: Diagnosis not present

## 2019-06-16 DIAGNOSIS — L03119 Cellulitis of unspecified part of limb: Secondary | ICD-10-CM | POA: Diagnosis not present

## 2019-06-16 DIAGNOSIS — R278 Other lack of coordination: Secondary | ICD-10-CM | POA: Diagnosis not present

## 2019-06-16 DIAGNOSIS — D638 Anemia in other chronic diseases classified elsewhere: Secondary | ICD-10-CM | POA: Diagnosis not present

## 2019-06-16 DIAGNOSIS — L0390 Cellulitis, unspecified: Secondary | ICD-10-CM | POA: Diagnosis present

## 2019-06-16 DIAGNOSIS — M2550 Pain in unspecified joint: Secondary | ICD-10-CM | POA: Diagnosis not present

## 2019-06-16 DIAGNOSIS — E785 Hyperlipidemia, unspecified: Secondary | ICD-10-CM | POA: Diagnosis present

## 2019-06-16 DIAGNOSIS — G3184 Mild cognitive impairment, so stated: Secondary | ICD-10-CM | POA: Diagnosis not present

## 2019-06-16 DIAGNOSIS — Z66 Do not resuscitate: Secondary | ICD-10-CM | POA: Diagnosis present

## 2019-06-16 DIAGNOSIS — D631 Anemia in chronic kidney disease: Secondary | ICD-10-CM | POA: Diagnosis present

## 2019-06-16 DIAGNOSIS — Z7901 Long term (current) use of anticoagulants: Secondary | ICD-10-CM | POA: Diagnosis not present

## 2019-06-16 DIAGNOSIS — M159 Polyosteoarthritis, unspecified: Secondary | ICD-10-CM | POA: Diagnosis not present

## 2019-06-16 DIAGNOSIS — Z20828 Contact with and (suspected) exposure to other viral communicable diseases: Secondary | ICD-10-CM | POA: Diagnosis present

## 2019-06-16 DIAGNOSIS — L8990 Pressure ulcer of unspecified site, unspecified stage: Secondary | ICD-10-CM | POA: Insufficient documentation

## 2019-06-16 DIAGNOSIS — L97919 Non-pressure chronic ulcer of unspecified part of right lower leg with unspecified severity: Secondary | ICD-10-CM | POA: Diagnosis not present

## 2019-06-16 DIAGNOSIS — R0902 Hypoxemia: Secondary | ICD-10-CM | POA: Diagnosis not present

## 2019-06-16 DIAGNOSIS — L03115 Cellulitis of right lower limb: Secondary | ICD-10-CM | POA: Diagnosis present

## 2019-06-16 DIAGNOSIS — N179 Acute kidney failure, unspecified: Secondary | ICD-10-CM | POA: Diagnosis present

## 2019-06-16 DIAGNOSIS — H3530 Unspecified macular degeneration: Secondary | ICD-10-CM | POA: Diagnosis present

## 2019-06-16 LAB — CBC
HCT: 24.7 % — ABNORMAL LOW (ref 36.0–46.0)
Hemoglobin: 7.9 g/dL — ABNORMAL LOW (ref 12.0–15.0)
MCH: 33.8 pg (ref 26.0–34.0)
MCHC: 32 g/dL (ref 30.0–36.0)
MCV: 105.6 fL — ABNORMAL HIGH (ref 80.0–100.0)
Platelets: 302 10*3/uL (ref 150–400)
RBC: 2.34 MIL/uL — ABNORMAL LOW (ref 3.87–5.11)
RDW: 16.7 % — ABNORMAL HIGH (ref 11.5–15.5)
WBC: 7 10*3/uL (ref 4.0–10.5)
nRBC: 0 % (ref 0.0–0.2)

## 2019-06-16 LAB — BASIC METABOLIC PANEL
Anion gap: 9 (ref 5–15)
BUN: 37 mg/dL — ABNORMAL HIGH (ref 8–23)
CO2: 25 mmol/L (ref 22–32)
Calcium: 7.9 mg/dL — ABNORMAL LOW (ref 8.9–10.3)
Chloride: 104 mmol/L (ref 98–111)
Creatinine, Ser: 1.27 mg/dL — ABNORMAL HIGH (ref 0.44–1.00)
GFR calc Af Amer: 41 mL/min — ABNORMAL LOW (ref >60)
GFR calc non Af Amer: 35 mL/min — ABNORMAL LOW (ref >60)
Glucose, Bld: 125 mg/dL — ABNORMAL HIGH (ref 70–99)
Potassium: 3 mmol/L — ABNORMAL LOW (ref 3.5–5.1)
Sodium: 138 mmol/L (ref 135–145)

## 2019-06-16 LAB — MRSA PCR SCREENING: MRSA by PCR: NEGATIVE

## 2019-06-16 MED ORDER — METOPROLOL TARTRATE 5 MG/5ML IV SOLN
2.5000 mg | INTRAVENOUS | Status: DC | PRN
  Administered 2019-06-16 – 2019-06-17 (×4): 2.5 mg via INTRAVENOUS
  Filled 2019-06-16 (×4): qty 5

## 2019-06-16 MED ORDER — ORAL CARE MOUTH RINSE
15.0000 mL | Freq: Two times a day (BID) | OROMUCOSAL | Status: DC
  Administered 2019-06-16 – 2019-06-19 (×4): 15 mL via OROMUCOSAL

## 2019-06-16 MED ORDER — CHLORHEXIDINE GLUCONATE 0.12 % MT SOLN
15.0000 mL | Freq: Two times a day (BID) | OROMUCOSAL | Status: DC
  Administered 2019-06-17 – 2019-06-19 (×4): 15 mL via OROMUCOSAL
  Filled 2019-06-16 (×5): qty 15

## 2019-06-16 MED ORDER — ACETAMINOPHEN 325 MG PO TABS: 650.0000 mg | ORAL_TABLET | Freq: Four times a day (QID) | ORAL | Status: DC | PRN

## 2019-06-16 MED ORDER — HEPARIN SODIUM (PORCINE) 5000 UNIT/ML IJ SOLN
5000.0000 [IU] | Freq: Three times a day (TID) | INTRAMUSCULAR | Status: DC
  Administered 2019-06-16 – 2019-06-18 (×8): 5000 [IU] via SUBCUTANEOUS
  Filled 2019-06-16 (×8): qty 1

## 2019-06-16 MED ORDER — HALOPERIDOL LACTATE 5 MG/ML IJ SOLN: 5.0000 mg | Freq: Four times a day (QID) | INTRAMUSCULAR | Status: DC | PRN

## 2019-06-16 MED ORDER — SODIUM CHLORIDE 0.9% FLUSH
3.0000 mL | Freq: Two times a day (BID) | INTRAVENOUS | Status: DC
  Administered 2019-06-16 – 2019-06-19 (×6): 3 mL via INTRAVENOUS

## 2019-06-16 MED ORDER — SODIUM CHLORIDE 0.9 % IV SOLN
INTRAVENOUS | Status: AC
  Administered 2019-06-16 – 2019-06-17 (×2): via INTRAVENOUS

## 2019-06-16 MED ORDER — ONDANSETRON HCL 4 MG/2ML IJ SOLN: 4.0000 mg | Freq: Four times a day (QID) | INTRAMUSCULAR | Status: DC | PRN

## 2019-06-16 MED ORDER — VANCOMYCIN HCL IN DEXTROSE 1-5 GM/200ML-% IV SOLN
1000.0000 mg | Freq: Once | INTRAVENOUS | Status: AC
  Administered 2019-06-16: 1000 mg via INTRAVENOUS
  Filled 2019-06-16: qty 200

## 2019-06-16 MED ORDER — ACETAMINOPHEN 650 MG RE SUPP: 650.0000 mg | Freq: Four times a day (QID) | RECTAL | Status: DC | PRN

## 2019-06-16 MED ORDER — POTASSIUM CHLORIDE 10 MEQ/100ML IV SOLN
10.0000 meq | INTRAVENOUS | Status: AC
  Administered 2019-06-16 (×4): 10 meq via INTRAVENOUS
  Filled 2019-06-16 (×4): qty 100

## 2019-06-16 MED ORDER — SODIUM CHLORIDE 0.9% FLUSH: 3.0000 mL | INTRAVENOUS | Status: DC | PRN

## 2019-06-16 MED ORDER — ONDANSETRON HCL 4 MG PO TABS: 4.0000 mg | ORAL_TABLET | Freq: Four times a day (QID) | ORAL | Status: DC | PRN

## 2019-06-16 MED ORDER — POTASSIUM CHLORIDE 10 MEQ/100ML IV SOLN
10.0000 meq | INTRAVENOUS | Status: DC
  Filled 2019-06-16 (×4): qty 100

## 2019-06-16 MED ORDER — SODIUM CHLORIDE 0.9 % IV SOLN
INTRAVENOUS | Status: DC | PRN
  Administered 2019-06-16 (×2): 250 mL via INTRAVENOUS
  Administered 2019-06-17: 1000 mL via INTRAVENOUS
  Administered 2019-06-18: 250 mL via INTRAVENOUS

## 2019-06-16 MED ORDER — CLINDAMYCIN PHOSPHATE 600 MG/50ML IV SOLN
600.0000 mg | Freq: Three times a day (TID) | INTRAVENOUS | Status: DC
  Administered 2019-06-16 – 2019-06-17 (×4): 600 mg via INTRAVENOUS
  Filled 2019-06-16 (×6): qty 50

## 2019-06-16 NOTE — Progress Notes (Signed)
Admitted for altered mental status, right leg pain, patient is admitted for leg cellulitis, according to documentation patient has been combative, refusing medications for the last 2 days, patient is confused at baseline this morning. Altered mental status of unclear etiology likely baseline dementia getting worse, CODE STATUS DNR, orders placed,  Right leg cellulitis, patient is on IV antibiotics. 3.  Acute renal failure on CKD stage III, improved renal function. 4.  Hypokalemia, replace potassium. #5 dementia with combative behavior, patient received Haldol, Ativan, now sleeping, unknown baseline. #6. chronic atrial fibrillation, patient follows up with Dr. Midge Minium, patient needs IV metoprolol as patient is confused, we cannot use oral metoprolol.  Heart rate is around 113 bpm. CODE STATUS DNR.

## 2019-06-16 NOTE — Progress Notes (Signed)
Admission data base completed with as much information that is available from the chart, from patient assessment, and from information provided by her Son.

## 2019-06-16 NOTE — ED Notes (Signed)
During assessment, noted that pt had soiled her diaper. Pt changed and pericare peformed. New linens placed on bed. While cleaning the patient, an approx 6cm erythematous area was noted around the sacrum. Sacral pad placed. Patient returned to comfortable position and resting.
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
## 2019-06-16 NOTE — Progress Notes (Signed)
Transferred patient to a low bed with mats on the floor.

## 2019-06-16 NOTE — Progress Notes (Signed)
Spoke with her son. He says he talked with his mother on Sunday, she was disoriented but alert and carrying on a reasonably appropriate conversation.

## 2019-06-16 NOTE — Progress Notes (Addendum)
Patient does not respond to verbal conversation or requests.  She does state that "it hurts" whenever we touch her for vital signs, assessment tasks, or move her in the bed.  Intermittent with "it hurts" she says she's cold.  She fights against [place] whenever we try to provide care. Warm blankets applied.  Room temperature increased.

## 2019-06-16 NOTE — H&P (Signed)
[facility] [place] NAME: Lindsey Cline    MR#:  [id number]  DATE OF BIRTH:  [birth year]  DATE OF ADMISSION:  06/15/2019  PRIMARY CARE PHYSICIAN: Venia Carbon, MD   REQUESTING/REFERRING PHYSICIAN: Archie Balboa, MD  CHIEF COMPLAINT:   Chief Complaint  Patient presents with  . Altered Mental Status  . Leg Pain    right leg    HISTORY OF PRESENT ILLNESS:  Lindsey Cline  is a 85 y.o. female who presents with chief complaint as above.  Patient brought to the ED due to alteration of mental status.  She is somewhat combative here in the ED initially.  She has what appears to be some lower extremity cellulitis.  Work-up otherwise in the ED is largely within normal limits.  Patient reportedly has not been taking her medications at home, though there is no other clear indication for why she might be so altered.  PAST MEDICAL HISTORY:   Past Medical History:  Diagnosis Date  . A-fib ([place])   . Heart murmur   . Hyperlipidemia   . Hypertension   . Kidney problem   . Macular degeneration   . Osteoporosis    osteoarthritis     PAST SURGICAL HISTORY:   Past Surgical History:  Procedure Laterality Date  . APPENDECTOMY  1942  . CATARACT EXTRACTION  1999  . INGUINAL HERNIA REPAIR  1946   Left  . TONSILLECTOMY  1928   ? Adenoids  . VAGINAL HYSTERECTOMY  1952     SOCIAL HISTORY:   Social History   Tobacco Use  . Smoking status: Never Smoker  . Smokeless tobacco: Never Used  Substance Use Topics  . Alcohol use: Yes    Alcohol/week: 2.0 standard drinks    Types: 2 Glasses of wine per week    Comment: WINE 5 DAYS PER WEEK     FAMILY HISTORY:   Family History  Problem Relation Age of Onset  . Cancer Mother        colon  . Heart disease Father      DRUG ALLERGIES:  No Known Allergies  MEDICATIONS AT HOME:   Prior to Admission medications   Medication Sig Start Date End Date Taking? Authorizing Provider   acetaminophen (TYLENOL) 325 MG tablet Take 650 mg by mouth 2 (two) times daily.    [provider]  apixaban (ELIQUIS) 2.5 MG TABS tablet Take 1 tablet (2.5 mg total) by mouth 2 (two) times daily. 12/29/14   Wellington Hampshire, MD  lisinopril-hydrochlorothiazide (PRINZIDE,ZESTORETIC) 20-12.5 MG per tablet TAKE 1 TABLET BY MOUTH EVERY DAY 07/05/15   Lucille Passy, MD  metoprolol tartrate (LOPRESSOR) 25 MG tablet Take 1 tablet (25 mg total) by mouth 2 (two) times daily. 01/29/15   Wellington Hampshire, MD  mirtazapine (REMERON) 15 MG tablet Take by mouth daily. 05/27/19 05/26/20  [provider]  Nutritional Supplements (NUTRITIONAL SUPPLEMENT PO) Take by mouth 2 (two) times daily.    [provider]  Probiotic Product (PROBIOTIC DAILY PO) Take by mouth daily.    [provider]  Vitamin D, Ergocalciferol, (DRISDOL) 50000 units CAPS capsule Take 50,000 Units by mouth every 14 (fourteen) days.     [provider]    REVIEW OF SYSTEMS:  Review of Systems  Unable to perform ROS: Acuity of condition     VITAL SIGNS:   Vitals:   06/15/19 2048 06/15/19 2051 06/15/19 2230 06/15/19 2300  BP:   Marland Kitchen)  82/31 (!) 89/44  Pulse:   (!) 108 97  Resp:   18 (!) 23  Temp:  98.9 F (37.2 C)    TempSrc:  Axillary    SpO2:   96% 99%  Weight: 49.9 kg     Height: 5\' 4"  (1.626 m)      Wt Readings from Last 3 Encounters:  06/15/19 49.9 kg  06/03/19 54 kg  01/30/19 58.1 kg    PHYSICAL EXAMINATION:  Physical Exam  Vitals reviewed. Constitutional: She appears well-developed and well-nourished. No distress.  HENT:  Head: Normocephalic and atraumatic.  Mouth/Throat: Oropharynx is clear and moist.  Eyes: Pupils are equal, round, and reactive to light. Conjunctivae and EOM are normal. No scleral icterus.  Neck: Normal range of motion. Neck supple. No JVD present. No thyromegaly present.  Cardiovascular: Normal rate and intact distal pulses. Exam reveals no gallop and no  friction rub.  No murmur heard. Irregular rhythm  Respiratory: Effort normal and breath sounds normal. No respiratory distress. She has no wheezes. She has no rales.  GI: Soft. Bowel sounds are normal. She exhibits no distension. There is no abdominal tenderness.  Musculoskeletal: Normal range of motion.        General: No edema.     Comments: No arthritis, no gout  Lymphadenopathy:    She has no cervical adenopathy.  Neurological: No cranial nerve deficit.  Unable to fully assess due to patient condition  Skin: Skin is warm and dry. No rash noted. There is erythema (With warmth of her lower extremity).  Psychiatric:  Unable to assess due to patient condition    LABORATORY PANEL:   CBC Recent Labs  Lab 06/15/19 2225  WBC 8.5  HGB 9.2*  HCT 29.0*  PLT 360   ------------------------------------------------------------------------------------------------------------------  Chemistries  Recent Labs  Lab 06/15/19 2225  NA 141  K 3.9  CL 100  CO2 29  GLUCOSE 95  BUN 42*  CREATININE 1.51*  CALCIUM 8.9   ------------------------------------------------------------------------------------------------------------------  Cardiac Enzymes No results for input(s): TROPONINI in the last 168 hours. ------------------------------------------------------------------------------------------------------------------  RADIOLOGY:  Ct Head Wo Contrast  Result Date: 06/15/2019 CLINICAL DATA:  Altered mental status EXAM: CT HEAD WITHOUT CONTRAST TECHNIQUE: Contiguous axial images were obtained from the base of the skull through the vertex without intravenous contrast. COMPARISON:  01/22/2016 FINDINGS: Brain: There is atrophy and chronic small vessel disease changes. No acute intracranial abnormality. Specifically, no hemorrhage, hydrocephalus, mass lesion, acute infarction, or significant intracranial injury. Vascular: No hyperdense vessel or unexpected calcification. Skull: No acute  calvarial abnormality. Sinuses/Orbits: No acute finding Other: None IMPRESSION: Atrophy, chronic microvascular disease. No acute intracranial abnormality. Electronically Signed   By: Rolm Baptise M.D.   On: 06/15/2019 23:51    EKG:   Orders placed or performed in visit on 11/21/18  . EKG 12-Lead    IMPRESSION AND PLAN:  Principal Problem:   Cellulitis -IV antibiotics Active Problems:   Chronic atrial fibrillation -  -continue home medications   AMS (altered mental status) -unclear etiology.  Labs were okay, CT head showed no acute abnormality to explain her mental status change.  Reportedly she had not been taking her meds seems likely to be more of an effect of her mental status change as she is not on any home meds that would precipitate this kind of mental status change when avoided.  Treat her cellulitis as above, monitor for any improvement with hydration and treatment of her infection.  If she does not improve over  the next 24 hours or so, may need neurology consult and/or MRI brain   Essential hypertension, benign -continue home meds   CKD (chronic kidney disease), stage III ([place]) -at baseline, avoid nephrotoxins and monitor  Chart review performed and case discussed with ED provider. Labs, imaging and/or ECG reviewed by provider and discussed with patient/family. Management plans discussed with the patient and/or family.  COVID-19 status: Pending  DVT PROPHYLAXIS: SubQ heparin  GI PROPHYLAXIS:  None  ADMISSION STATUS: Observation  CODE STATUS: Full Advance Directive Documentation     Most Recent Value  Type of Advance Directive  Out of facility DNR (pink MOST or yellow form)  Pre-existing out of facility DNR order (yellow form or pink MOST form)  -  "MOST" Form in Place?  -      TOTAL TIME TAKING CARE OF THIS PATIENT: 40 minutes.   This patient was evaluated in the context of the global COVID-19 pandemic, which necessitated consideration that the patient might be at  risk for infection with the SARS-CoV-2 virus that causes COVID-19. Institutional protocols and algorithms that pertain to the evaluation of patients at risk for COVID-19 are in a state of rapid change based on information released by regulatory bodies including the CDC and federal and state organizations. These policies and algorithms were followed to the best of this provider's knowledge to date during the patient's care at this facility.  Ethlyn Daniels 06/16/2019, 12:13 AM  [facility]  Office  [phone]  CC: Primary care physician; Venia Carbon, MD  Note:  This document was prepared using Dragon voice recognition software and may include unintentional dictation errors.

## 2019-06-17 ENCOUNTER — Inpatient Hospital Stay: Payer: Medicare Other

## 2019-06-17 DIAGNOSIS — I10 Essential (primary) hypertension: Secondary | ICD-10-CM | POA: Diagnosis not present

## 2019-06-17 DIAGNOSIS — L0390 Cellulitis, unspecified: Secondary | ICD-10-CM | POA: Diagnosis not present

## 2019-06-17 DIAGNOSIS — R4182 Altered mental status, unspecified: Secondary | ICD-10-CM | POA: Diagnosis not present

## 2019-06-17 DIAGNOSIS — I482 Chronic atrial fibrillation, unspecified: Secondary | ICD-10-CM | POA: Diagnosis not present

## 2019-06-17 LAB — CBC
HCT: 25.5 % — ABNORMAL LOW (ref 36.0–46.0)
Hemoglobin: 8.2 g/dL — ABNORMAL LOW (ref 12.0–15.0)
MCH: 34 pg (ref 26.0–34.0)
MCHC: 32.2 g/dL (ref 30.0–36.0)
MCV: 105.8 fL — ABNORMAL HIGH (ref 80.0–100.0)
Platelets: 328 10*3/uL (ref 150–400)
RBC: 2.41 MIL/uL — ABNORMAL LOW (ref 3.87–5.11)
RDW: 16.9 % — ABNORMAL HIGH (ref 11.5–15.5)
WBC: 6.5 10*3/uL (ref 4.0–10.5)
nRBC: 0.3 % — ABNORMAL HIGH (ref 0.0–0.2)

## 2019-06-17 LAB — BASIC METABOLIC PANEL
Anion gap: 11 (ref 5–15)
BUN: 33 mg/dL — ABNORMAL HIGH (ref 8–23)
CO2: 24 mmol/L (ref 22–32)
Calcium: 8.1 mg/dL — ABNORMAL LOW (ref 8.9–10.3)
Chloride: 103 mmol/L (ref 98–111)
Creatinine, Ser: 1.51 mg/dL — ABNORMAL HIGH (ref 0.44–1.00)
GFR calc Af Amer: 33 mL/min — ABNORMAL LOW (ref >60)
GFR calc non Af Amer: 29 mL/min — ABNORMAL LOW (ref >60)
Glucose, Bld: 82 mg/dL (ref 70–99)
Potassium: 4.4 mmol/L (ref 3.5–5.1)
Sodium: 138 mmol/L (ref 135–145)

## 2019-06-17 LAB — IRON AND TIBC
Iron: 28 ug/dL (ref 28–170)
Saturation Ratios: 16 % (ref 10.4–31.8)
TIBC: 172 ug/dL — ABNORMAL LOW (ref 250–450)
UIBC: 144 ug/dL

## 2019-06-17 LAB — RETICULOCYTES
Immature Retic Fract: 28.3 % — ABNORMAL HIGH (ref 2.3–15.9)
RBC.: 2.29 MIL/uL — ABNORMAL LOW (ref 3.87–5.11)
Retic Count, Absolute: 78.5 10*3/uL (ref 19.0–186.0)
Retic Ct Pct: 3.4 % — ABNORMAL HIGH (ref 0.4–3.1)

## 2019-06-17 LAB — VITAMIN B12: Vitamin B-12: 984 pg/mL — ABNORMAL HIGH (ref 180–914)

## 2019-06-17 LAB — FERRITIN: Ferritin: 193 ng/mL (ref 11–307)

## 2019-06-17 LAB — FOLATE: Folate: 7.1 ng/mL (ref >5.9)

## 2019-06-17 LAB — SARS CORONAVIRUS 2 BY RT PCR (HOSPITAL ORDER, PERFORMED IN ~~LOC~~ HOSPITAL LAB): SARS Coronavirus 2: NEGATIVE

## 2019-06-17 MED ORDER — METOPROLOL TARTRATE 25 MG PO TABS
25.0000 mg | ORAL_TABLET | Freq: Two times a day (BID) | ORAL | Status: DC
  Administered 2019-06-17 – 2019-06-19 (×4): 25 mg via ORAL
  Filled 2019-06-17 (×4): qty 1

## 2019-06-17 MED ORDER — POTASSIUM CHLORIDE 10 MEQ/100ML IV SOLN
10.0000 meq | INTRAVENOUS | Status: DC
  Filled 2019-06-17 (×3): qty 100

## 2019-06-17 MED ORDER — CEFAZOLIN SODIUM-DEXTROSE 1-4 GM/50ML-% IV SOLN
1.0000 g | Freq: Two times a day (BID) | INTRAVENOUS | Status: DC
  Administered 2019-06-17 – 2019-06-19 (×5): 1 g via INTRAVENOUS
  Filled 2019-06-17 (×8): qty 50

## 2019-06-17 NOTE — NC FL2 (Signed)
[place] LEVEL OF CARE SCREENING TOOL     IDENTIFICATION  Patient Name: Lindsey Cline Birthdate: [birth year] Sex: female Admission Date (Current Location): 06/15/2019  [place] and [place] Number:  Engineering geologist and Address:  [facility], [address] [place], [place] [address]      Provider Number: [id number]  Attending Physician Name and Address:  Dustin Flock, MD  Relative Name and Phone Number:  Lilianah, [place] [phone]  [phone]    Current Level of Care: Hospital Recommended Level of Care: [place] Prior Approval Number:    Date Approved/Denied:   PASRR Number: [id number]  Discharge Plan: SNF    Current Diagnoses: Patient Active Problem List   Diagnosis Date Noted  . Cellulitis 06/16/2019  . AMS (altered mental status) 06/16/2019  . Pressure injury of skin 06/16/2019  . Osteoporosis 05/15/2018  . Chronic atrial fibrillation 04/01/2015  . Memory loss 03/22/2015  . Mitral regurgitation 01/29/2015  . DNR (do not resuscitate) 12/28/2014  . CKD (chronic kidney disease), stage III ([place]) 12/28/2014  . Hearing loss 12/25/2013  . Essential hypertension, benign 09/14/2010  . Macular degeneration (senile) of retina 05/29/2008  . Osteoarthritis 05/29/2008    Orientation RESPIRATION BLADDER Height & Weight        Normal Incontinent Weight: 49.9 kg Height:  5\' 4"  (162.6 cm)  BEHAVIORAL SYMPTOMS/MOOD NEUROLOGICAL BOWEL NUTRITION STATUS      Incontinent Diet(heart healthy)  AMBULATORY STATUS COMMUNICATION OF NEEDS Skin     Verbally Skin abrasions, Bruising, PU Stage and Appropriate Care                       Personal Care Assistance Level of Assistance  Bathing, Feeding, Dressing Bathing Assistance: Limited assistance Feeding assistance: Limited assistance Dressing Assistance: Limited assistance     Functional Limitations Info  Hearing   Hearing Info: Impaired(has hearing  aides)      [place]  PT (By licensed PT), OT (By licensed OT)     PT Frequency: 5x week OT Frequency: 5x week            Contractures Contractures Info: Not present    Additional Factors Info  Code Status, Allergies Code Status Info: DNR Allergies Info: NKA           Current Medications (06/17/2019):  This is the current hospital active medication list Current Facility-Administered Medications  Medication Dose Route Frequency Provider Last Rate Last Dose  . 0.9 %  sodium chloride infusion   Intravenous PRN Lance Coon, MD   Stopped at 06/16/19 1654  . acetaminophen (TYLENOL) tablet 650 mg  650 mg Oral Q6H PRN Lance Coon, MD       Or  . acetaminophen (TYLENOL) suppository 650 mg  650 mg Rectal Q6H PRN Lance Coon, MD      . ceFAZolin (ANCEF) IVPB 1 g/50 mL premix  1 g Intravenous Q12H Dustin Flock, MD 100 mL/hr at 06/17/19 1223 1 g at 06/17/19 1223  . chlorhexidine (PERIDEX) 0.12 % solution 15 mL  15 mL Mouth Rinse BID Epifanio Lesches, MD      . diphenhydrAMINE (BENADRYL) injection 25 mg  25 mg Intravenous Once Lance Coon, MD      . haloperidol lactate (HALDOL) injection 5 mg  5 mg Intramuscular Q6H PRN Lance Coon, MD      . heparin injection 5,000 Units  5,000 Units Subcutaneous Q8H Lance Coon, MD   5,000 Units at  06/17/19 1350  . LORazepam (ATIVAN) injection 2 mg  2 mg Intravenous Once Lance Coon, MD      . MEDLINE mouth rinse  15 mL Mouth Rinse q12n4p Epifanio Lesches, MD   15 mL at 06/17/19 1225  . metoprolol tartrate (LOPRESSOR) injection 2.5 mg  2.5 mg Intravenous Q4H PRN Epifanio Lesches, MD   2.5 mg at 06/17/19 0814  . metoprolol tartrate (LOPRESSOR) tablet 25 mg  25 mg Oral BID Dustin Flock, MD      . ondansetron [facility]) tablet 4 mg  4 mg Oral Q6H PRN Lance Coon, MD       Or  . ondansetron [facility]) injection 4 mg  4 mg Intravenous Q6H PRN Lance Coon, MD      . sodium chloride flush (NS) 0.9 %  injection 3 mL  3 mL Intravenous Q12H Epifanio Lesches, MD   3 mL at 06/16/19 2057  . sodium chloride flush (NS) 0.9 % injection 3 mL  3 mL Intravenous PRN Epifanio Lesches, MD         Discharge Medications: Please see discharge summary for a list of discharge medications.  Relevant Imaging Results:  Relevant Lab Results:   Additional Information SSN: [id number]  Elza Rafter, RN

## 2019-06-17 NOTE — Progress Notes (Signed)
I put her on the phone with her son today.  She immediately responded to him. She asked appropriate questions - asking how he and his family were doing. It was remarkable to see the transformation.  They spoke for only a few minutes.  After hanging up she responded to me and my questions.  She allowed me to brush her teeth and then ate a couple bites of peaches.  Several hours later she is still responding appropriately and no longer fighting [place] when we try to provide care.  I encouraged her son to visit her tomorrow which he is going to do.

## 2019-06-17 NOTE — Progress Notes (Signed)
[place] at [facility]                                                                                                                                                                                  Patient Demographics   Lindsey Cline, is a 85 y.o. female, DOB - [birth year], [id number]  Admit date - 06/15/2019   Admitting Physician Lance Coon, MD  Outpatient Primary MD for the patient is Venia Carbon, MD   LOS - 1  Subjective: Patient continues to be confused unable to provide review of systems    Review of Systems:   CONSTITUTIONAL: Unable to provide due to her current mental status  Vitals:   Vitals:   06/17/19 0722 06/17/19 0801 06/17/19 0913 06/17/19 1022  BP: (!) 138/33 125/68    Pulse:  (!) 113 89 (!) 119  Resp:  (!) 24    Temp: (!) 97.3 F (36.3 C) 98.8 F (37.1 C)    TempSrc: Axillary Oral    SpO2: 97% 96%    Weight:      Height:        Wt Readings from Last 3 Encounters:  06/15/19 49.9 kg  06/03/19 54 kg  01/30/19 58.1 kg     Intake/Output Summary (Last 24 hours) at 06/17/2019 1310 Last data filed at 06/17/2019 1259 Gross per 24 hour  Intake 202.23 ml  Output 400 ml  Net -197.77 ml    Physical Exam:   GENERAL: Pleasant-appearing in no apparent distress.  HEAD, EYES, EARS, NOSE AND THROAT: Atraumatic, normocephalic. Extraocular muscles are intact. Pupils equal and reactive to light. Sclerae anicteric. No conjunctival injection. No oro-pharyngeal erythema.  NECK: Supple. There is no jugular venous distention. No bruits, no lymphadenopathy, no thyromegaly.  HEART: Regular rate and rhythm,. No murmurs, no rubs, no clicks.  LUNGS: Clear to auscultation bilaterally. No rales or rhonchi. No wheezes.  ABDOMEN: Soft, flat, nontender, nondistended. Has good bowel sounds. No hepatosplenomegaly appreciated.  EXTREMITIES: No evidence of any cyanosis, clubbing, or peripheral edema.  +2 pedal and radial pulses bilaterally.   NEUROLOGIC: .  Patient is confused not able to provide any review of systems SKIN: There is some warmth and erythema involving her bilateral lower extremity Psych: Not anxious, depressed LN: No inguinal LN enlargement    Antibiotics   Anti-infectives (From admission, onward)   Start     Dose/Rate Route Frequency Ordered Stop   06/17/19 1200  ceFAZolin (ANCEF) IVPB 1 g/50 mL premix     1 g 100 mL/hr over 30 Minutes Intravenous Every 12 hours 06/17/19 1151     06/16/19 0600  clindamycin (CLEOCIN) IVPB 600 mg  Status:  Discontinued     600 mg 100 mL/hr over 30 Minutes Intravenous Every 8 hours 06/16/19 0242 06/17/19 1149   06/16/19 0015  vancomycin (VANCOCIN) IVPB 1000 mg/200 mL premix     1,000 mg 200 mL/hr over 60 Minutes Intravenous  Once 06/16/19 0001 06/16/19 0344      Medications   Scheduled Meds: . chlorhexidine  15 mL Mouth Rinse BID  . diphenhydrAMINE  25 mg Intravenous Once  . heparin  5,000 Units Subcutaneous Q8H  . LORazepam  2 mg Intravenous Once  . mouth rinse  15 mL Mouth Rinse q12n4p  . metoprolol tartrate  25 mg Oral BID  . sodium chloride flush  3 mL Intravenous Q12H   Continuous Infusions: . sodium chloride Stopped (06/16/19 1654)  . sodium chloride 75 mL/hr at 06/16/19 1659  .  ceFAZolin (ANCEF) IV 1 g (06/17/19 1223)   PRN Meds:.sodium chloride, acetaminophen **OR** acetaminophen, haloperidol lactate, metoprolol tartrate, ondansetron **OR** ondansetron (ZOFRAN) IV, sodium chloride flush   Data Review:   Micro Results Recent Results (from the past 240 hour(s))  SARS Coronavirus 2 [facility] order, Performed in [place] hospital lab)     Status: None   Collection Time: 06/16/19 12:57 AM  Result Value Ref Range Status   SARS Coronavirus 2 NEGATIVE NEGATIVE Final    Comment: (NOTE) If result is NEGATIVE SARS-CoV-2 target nucleic acids are NOT DETECTED. The SARS-CoV-2 RNA is generally detectable in upper and lower  respiratory specimens during the  acute phase of infection. The lowest  concentration of SARS-CoV-2 viral copies this assay can detect is 250  copies / mL. A negative result does not preclude SARS-CoV-2 infection  and should not be used as the sole basis for treatment or other  patient management decisions.  A negative result may occur with  improper specimen collection / handling, submission of specimen other  than nasopharyngeal swab, presence of viral mutation(s) within the  areas targeted by this assay, and inadequate number of viral copies  (<250 copies / mL). A negative result must be combined with clinical  observations, patient history, and epidemiological information. If result is POSITIVE SARS-CoV-2 target nucleic acids are DETECTED. The SARS-CoV-2 RNA is generally detectable in upper and lower  respiratory specimens dur ing the acute phase of infection.  Positive  results are indicative of active infection with SARS-CoV-2.  Clinical  correlation with patient history and other diagnostic information is  necessary to determine patient infection status.  Positive results do  not rule out bacterial infection or co-infection with other viruses. If result is PRESUMPTIVE POSTIVE SARS-CoV-2 nucleic acids MAY BE PRESENT.   A presumptive positive result was obtained on the submitted specimen  and confirmed on repeat testing.  While 2019 novel coronavirus  (SARS-CoV-2) nucleic acids may be present in the submitted sample  additional confirmatory testing may be necessary for epidemiological  and / or clinical management purposes  to differentiate between  SARS-CoV-2 and other Sarbecovirus currently known to infect humans.  If clinically indicated additional testing with an alternate test  methodology [phone]) is advised. The SARS-CoV-2 RNA is generally  detectable in upper and lower respiratory sp ecimens during the acute  phase of infection. The expected result is Negative. Fact Sheet for Patients:   StrictlyIdeas.no Fact Sheet for Healthcare Providers: BankingDealers.co.za This test is not yet approved or cleared by the [place] FDA and has been authorized for detection and/or diagnosis of SARS-CoV-2 by FDA under an Emergency Use Authorization (EUA).  This EUA will remain in effect (meaning this test can be used) for the duration of the COVID-19 declaration under Section 564(b)(1) of the Act, 21 U.S.C. section 360bbb-3(b)(1), unless the authorization is terminated or revoked sooner. Performed at [facility], [address] [place], [place] [address]   MRSA PCR Screening     Status: None   Collection Time: 06/16/19  2:55 AM   Specimen: Nasal Mucosa; Nasopharyngeal  Result Value Ref Range Status   MRSA by PCR NEGATIVE NEGATIVE Final    Comment:        The GeneXpert MRSA Assay (FDA approved for NASAL specimens only), is one component of a comprehensive MRSA colonization surveillance program. It is not intended to diagnose MRSA infection nor to guide or monitor treatment for MRSA infections. Performed at [facility], [address], [place], [place] [address]     Radiology Reports Ct Head Wo Contrast  Result Date: 06/15/2019 CLINICAL DATA:  Altered mental status EXAM: CT HEAD WITHOUT CONTRAST TECHNIQUE: Contiguous axial images were obtained from the base of the skull through the vertex without intravenous contrast. COMPARISON:  01/22/2016 FINDINGS: Brain: There is atrophy and chronic small vessel disease changes. No acute intracranial abnormality. Specifically, no hemorrhage, hydrocephalus, mass lesion, acute infarction, or significant intracranial injury. Vascular: No hyperdense vessel or unexpected calcification. Skull: No acute calvarial abnormality. Sinuses/Orbits: No acute finding Other: None IMPRESSION: Atrophy, chronic microvascular disease. No acute intracranial abnormality. Electronically Signed    By: Rolm Baptise M.D.   On: 06/15/2019 23:51     CBC Recent Labs  Lab 06/15/19 2225 06/16/19 0402 06/17/19 1041  WBC 8.5 7.0 6.5  HGB 9.2* 7.9* 8.2*  HCT 29.0* 24.7* 25.5*  PLT 360 302 328  MCV 105.8* 105.6* 105.8*  MCH 33.6 33.8 34.0  MCHC 31.7 32.0 32.2  RDW 16.9* 16.7* 16.9*  LYMPHSABS 0.9  --   --   MONOABS 0.7  --   --   EOSABS 0.0  --   --   BASOSABS 0.0  --   --     Chemistries  Recent Labs  Lab 06/15/19 2225 06/16/19 0402 06/17/19 0929  NA 141 138 138  K 3.9 3.0* 4.4  CL 100 104 103  CO2 29 25 24   GLUCOSE 95 125* 82  BUN 42* 37* 33*  CREATININE 1.51* 1.27* 1.51*  CALCIUM 8.9 7.9* 8.1*   ------------------------------------------------------------------------------------------------------------------ estimated creatinine clearance is 16.8 mL/min (A) (by C-G formula based on SCr of 1.51 mg/dL (H)). ------------------------------------------------------------------------------------------------------------------ No results for input(s): HGBA1C in the last 72 hours. ------------------------------------------------------------------------------------------------------------------ No results for input(s): CHOL, HDL, LDLCALC, TRIG, CHOLHDL, LDLDIRECT in the last 72 hours. ------------------------------------------------------------------------------------------------------------------ No results for input(s): TSH, T4TOTAL, T3FREE, THYROIDAB in the last 72 hours.  Invalid input(s): FREET3 ------------------------------------------------------------------------------------------------------------------ Recent Labs    06/16/19 0402  FOLATE 7.1  FERRITIN 193  TIBC 172*  IRON 28  RETICCTPCT 3.4*    Coagulation profile No results for input(s): INR, PROTIME in the last 168 hours.  No results for input(s): DDIMER in the last 72 hours.  Cardiac Enzymes No results for input(s): CKMB, TROPONINI, MYOGLOBIN in the last 168 hours.  Invalid input(s):  CK ------------------------------------------------------------------------------------------------------------------ Invalid input(s): [place]  Patient is 85 year old admitted with acute encephalopathy   1.  Acute encephalopathy likely due to underlying cellulitis Discussed with patient's primary care provider who states that patient does not have dementia she is at baseline normal without any memory issues or confusion I am  not sure patient will be able to do an MRI of the brain   2.   Chronic atrial fibrillation -  -continue home medications if patient able to take oral meds     3.  Essential hypertension, benign -continue home meds   4.  CKD (chronic kidney disease), stage III (HCC) -at baseline, avoid nephrotoxins and monitor   5.  Acute on chronic anemia anemia panel reviewed folate level okay we are waiting for B12 level Continue to monitor Eliquis on hold       Code Status Orders  (From admission, onward)         Start     Ordered   06/16/19 0906  Do not attempt resuscitation (DNR)  Continuous    Question Answer Comment  In the event of cardiac or respiratory ARREST Do not call a "code blue"   In the event of cardiac or respiratory ARREST Do not perform Intubation, CPR, defibrillation or ACLS   In the event of cardiac or respiratory ARREST Use medication by any route, position, wound care, and other measures to relive pain and suffering. May use oxygen, suction and manual treatment of airway obstruction as needed for comfort.   Comments nmp      06/16/19 [id number]        Code Status History    Date Active Date Inactive Code Status Order ID Comments User Context   06/16/2019 0242 06/16/2019 0905 Full Code [id number]  Lance Coon, MD Inpatient   Advance Care Planning Activity    Advance Directive Documentation     Most Recent Value  Type of Advance Directive  Out of facility DNR (pink MOST or yellow form)  Pre-existing out of facility DNR  order (yellow form or pink MOST form)  Pink MOST form placed in chart (order not valid for inpatient use)  "MOST" Form in Place?  -           Consults  none  DVT Prophylaxis  Lovenox   Lab Results  Component Value Date   PLT 328 06/17/2019     Time Spent in minutes   47min Greater than 50% of time spent in care coordination and counseling patient regarding the condition and plan of care.   Dustin Flock M.D on 06/17/2019 at 1:10 PM  Between 7am to 6pm - Pager - [phone]  After 6pm go to [email or web address] - Proofreader  [employer]   Office  [phone]

## 2019-06-17 NOTE — TOC Initial Note (Signed)
Transition of Care [facility]) - Initial/Assessment Note    Patient Details  Name: Lindsey Cline MRN: [id number] Date of Birth: [birth year]  [facility] CM/SW Contact:    Elza Rafter, RN Phone Number: 06/17/2019, 2:14 PM  Clinical Narrative:   Patient is a resident of [facility].  Spoke with [name] at [facility] (coordinator) and she states baseline is ambulation with walker; feeds self; assistance with shower and dressing.  Memory has declined and she becomes confused with time of day. [name] states she is very hard of hearing and uses hearing aides.  She has had multiple falls.  Currently on room air.  Spoke with son Jeneen Rinks; he is in agreement to work up for SNF.  FL2 complete and clinicals sent to [facility].   Will obtain PT evaluation when appropriate.  TOC will continue to follow and assist with discharge disposition.                 Expected Discharge Plan: [place]     Patient Goals and CMS Choice Patient states their goals for this hospitalization and ongoing recovery are:: Spoke with son Jeneen Rinks and he is in agreement to work up for SNF CMS Medicare.gov Compare Post Acute Care list provided to:: Patient Represenative (must comment) Choice offered to / list presented to : Adult Children  Expected Discharge Plan and Services Expected Discharge Plan: [place]   Discharge Planning Services: CM Consult   Living arrangements for the past 2 months: [place] Expected Discharge Date: 06/19/19                                    Prior Living Arrangements/Services Living arrangements for the past 2 months: [place] Lives with:: Facility Resident Patient language and need for interpreter reviewed:: No          Care giver support system in place?: Yes (comment)   Criminal Activity/Legal Involvement Pertinent to Current Situation/Hospitalization: No - Comment as needed  Activities of Daily  Living Home Assistive Devices/Equipment: None ADL Screening (condition at time of admission) Patient's cognitive ability adequate to safely complete daily activities?: No Is the patient deaf or have difficulty hearing?: Yes Does the patient have difficulty seeing, even when wearing glasses/contacts?: Yes Does the patient have difficulty concentrating, remembering, or making decisions?: Yes Patient able to express need for assistance with ADLs?: No Does the patient have difficulty dressing or bathing?: Yes Independently performs ADLs?: No Communication: Dependent Is this a change from baseline?: Pre-admission baseline Dressing (OT): Dependent Is this a change from baseline?: Pre-admission baseline Grooming: Dependent Is this a change from baseline?: Pre-admission baseline Feeding: Dependent Is this a change from baseline?: Pre-admission baseline Bathing: Dependent Is this a change from baseline?: Pre-admission baseline Toileting: Dependent Is this a change from baseline?: Pre-admission baseline In/Out Bed: Dependent Is this a change from baseline?: Pre-admission baseline Walks in Home: Dependent Is this a change from baseline?: Pre-admission baseline Does the patient have difficulty walking or climbing stairs?: Yes Weakness of Legs: Both Weakness of Arms/Hands: Both  Permission Sought/Granted Permission sought to share information with : Facility Art therapist granted to share information with : Yes, Verbal Permission Granted  Share Information with NAME: [phone]  Permission granted to share info w AGENCY: [facility]        Emotional Assessment Appearance:: Appears stated age Attitude/Demeanor/Rapport: Inconsistent  Orientation: : Fluctuating Orientation (Suspected and/or reported [place]) Alcohol / Substance Use: Not Applicable Psych Involvement: No (comment)  Admission diagnosis:  Altered mental status, unspecified altered mental  status type [R41.82] Cellulitis, unspecified cellulitis site [L03.90] Patient Active Problem List   Diagnosis Date Noted  . Cellulitis 06/16/2019  . AMS (altered mental status) 06/16/2019  . Pressure injury of skin 06/16/2019  . Osteoporosis 05/15/2018  . Chronic atrial fibrillation 04/01/2015  . Memory loss 03/22/2015  . Mitral regurgitation 01/29/2015  . DNR (do not resuscitate) 12/28/2014  . CKD (chronic kidney disease), stage III ([place]) 12/28/2014  . Hearing loss 12/25/2013  . Essential hypertension, benign 09/14/2010  . Macular degeneration (senile) of retina 05/29/2008  . Osteoarthritis 05/29/2008   PCP:  Venia Carbon, MD Pharmacy:   [facility] [place] [facility] [place] [address] [place] [address] Phone: [phone] Fax: [phone]     Social Determinants of Health (SDOH) Interventions    Readmission Risk Interventions No flowsheet data found.

## 2019-06-18 LAB — CBC
HCT: 27 % — ABNORMAL LOW (ref 36.0–46.0)
Hemoglobin: 8.6 g/dL — ABNORMAL LOW (ref 12.0–15.0)
MCH: 33.6 pg (ref 26.0–34.0)
MCHC: 31.9 g/dL (ref 30.0–36.0)
MCV: 105.5 fL — ABNORMAL HIGH (ref 80.0–100.0)
Platelets: 352 10*3/uL (ref 150–400)
RBC: 2.56 MIL/uL — ABNORMAL LOW (ref 3.87–5.11)
RDW: 17 % — ABNORMAL HIGH (ref 11.5–15.5)
WBC: 6.9 10*3/uL (ref 4.0–10.5)
nRBC: 0.4 % — ABNORMAL HIGH (ref 0.0–0.2)

## 2019-06-18 LAB — BASIC METABOLIC PANEL
Anion gap: 15 (ref 5–15)
BUN: 33 mg/dL — ABNORMAL HIGH (ref 8–23)
CO2: 21 mmol/L — ABNORMAL LOW (ref 22–32)
Calcium: 8.5 mg/dL — ABNORMAL LOW (ref 8.9–10.3)
Chloride: 107 mmol/L (ref 98–111)
Creatinine, Ser: 1.48 mg/dL — ABNORMAL HIGH (ref 0.44–1.00)
GFR calc Af Amer: 34 mL/min — ABNORMAL LOW (ref >60)
GFR calc non Af Amer: 29 mL/min — ABNORMAL LOW (ref >60)
Glucose, Bld: 97 mg/dL (ref 70–99)
Potassium: 4 mmol/L (ref 3.5–5.1)
Sodium: 143 mmol/L (ref 135–145)

## 2019-06-18 MED ORDER — LORATADINE 10 MG PO TABS
10.0000 mg | ORAL_TABLET | Freq: Every day | ORAL | Status: DC
  Administered 2019-06-18 – 2019-06-19 (×2): 10 mg via ORAL
  Filled 2019-06-18 (×2): qty 1

## 2019-06-18 MED ORDER — ENSURE ENLIVE PO LIQD
237.0000 mL | Freq: Two times a day (BID) | ORAL | Status: DC
  Administered 2019-06-18 – 2019-06-19 (×2): 237 mL via ORAL

## 2019-06-18 MED ORDER — ADULT MULTIVITAMIN W/MINERALS CH
1.0000 | ORAL_TABLET | Freq: Every day | ORAL | Status: DC
  Administered 2019-06-19: 1 via ORAL
  Filled 2019-06-18: qty 1

## 2019-06-18 MED ORDER — APIXABAN 2.5 MG PO TABS
2.5000 mg | ORAL_TABLET | Freq: Two times a day (BID) | ORAL | Status: DC
  Administered 2019-06-18 – 2019-06-19 (×3): 2.5 mg via ORAL
  Filled 2019-06-18 (×3): qty 1

## 2019-06-18 NOTE — Progress Notes (Signed)
Nutrition Follow-up  DOCUMENTATION CODES:   Underweight  INTERVENTION:   Ensure Enlive po BID, each supplement provides 350 kcal and 20 grams of protein  MVI daily   Dysphagia 3 diet   NUTRITION DIAGNOSIS:   Increased nutrient needs related to wound healing as evidenced by increased estimated needs  GOAL:   Patient will meet greater than or equal to 90% of their needs  MONITOR:   PO intake, Supplement acceptance, Labs, Weight trends, Skin, I & O's  REASON FOR ASSESSMENT:   Other (Comment)(Low BMI)    ASSESSMENT:   85 y/o female with h/o CKD III, HTN, Afib admitted with acute encephalopathy likely due to underlying cellulitis  RD working remotely.  Unable to speak with pt r/t AMS. Pt with increased estimated needs r/t wound healing. RD will add supplements and MVI to help pt meet her estimated needs. RD will also change pt to a dysphagia 3 diet. Per chart, pt down 18lbs(14%) over the past 4 months; this is significant weight loss.  Pt at high risk for malnutrition but unable to diagnose at this time as NFPE cannot be performed.    Medications reviewed and include: heparin  Labs reviewed: BUN 33(H), creat 1.48(H) Hgb 8.6(H), Hct 27.0(H)  Unable to complete Nutrition-Focused physical exam at this time.   Diet Order:   Diet Order            DIET DYS 3 Room service appropriate? No; Fluid consistency: Thin  Diet effective now             EDUCATION NEEDS:   Not appropriate for education at this time  Skin:  Skin Assessment: Reviewed RN Assessment(Stage II coccyx, L leg cellulitis)  Last BM:  9/9- type 6  Height:   Ht Readings from Last 1 Encounters:  06/15/19 5\' 4"  (1.626 m)    Weight:   Wt Readings from Last 1 Encounters:  06/15/19 49.9 kg    Ideal Body Weight:  54.5 kg  BMI:  Body mass index is 18.88 kg/m.  Estimated Nutritional Needs:   Kcal:  1200-1400kcal/day  Protein:  60-70g/day  Fluid:  >1.3L/day  Koleen Distance MS, RD,  LDN Pager #- [phone] Office#- [phone] After Hours Pager: [phone]

## 2019-06-18 NOTE — Plan of Care (Signed)
  Problem: Clinical Measurements: Goal: Ability to maintain clinical measurements within normal limits will improve Outcome: Progressing Goal: Cardiovascular complication will be avoided Outcome: Progressing   Problem: Pain Managment: Goal: General experience of comfort will improve Outcome: Progressing   Problem: Skin Integrity: Goal: Risk for impaired skin integrity will decrease Outcome: Progressing

## 2019-06-18 NOTE — Evaluation (Signed)
Physical Therapy Evaluation Patient Details Name: Lindsey Cline MRN: [id number] DOB: Mar 01, 1919 Today's Date: 06/18/2019   History of Present Illness  presented to ER secondary to AMS, LE pain; admitted for management of LE cellulitis  Clinical Impression  Patient resting with eyes closed upon entry to room; opens eyes and responds to voice, attempts participation with session.  Patient generally confused, oriented to self only; limited ability to follow simple verbal commands, often requiring therapist demonstration or hand-over-hand assist to initiate/complete isolated activity/movement.  Patient constantly talking/mumbling throughout session, often incoherent and incomprehensible.  Reoriented to situation and location multiple times; absent carry-over noted.  Currently requiring max/dep assist for bed mobility; min progressing to max assist for unsupported sitting balance.  Unsupported sitting edge of bed, min assist, progressing to max assist for management of spontaneous posterior LOB.  Unable to fully reorient (with patient pushing posteriorally after initial LOB) requiring return to supine for full recovery.  Suspect some degree of orthostasis; however, patient refused re-attempt at transition to upright at this time.  Will continue to assess/progress in subsequent session as able/appropriate. Would benefit from skilled PT to address above deficits and promote optimal return to PLOF; recommend transition to STR upon discharge from acute hospitalization.    Follow Up Recommendations SNF    Equipment Recommendations       Recommendations for Other Services       Precautions / Restrictions Precautions Precautions: Fall Precaution Comments: orthostatic? Restrictions Weight Bearing Restrictions: No      Mobility  Bed Mobility Overal bed mobility: Needs Assistance Bed Mobility: Supine to Sit;Sit to Supine     Supine to sit: Mod assist;Max assist Sit to supine: Max assist;Total  assist   General bed mobility comments: unsupported sitting edge of bed, min assist, progressing to max assist for management of spontaneous posterior LOB.  Unable to fully reorient (with patient pushing posteriorally after initial LOB) requiring return to supine for full recovery.  Transfers                 General transfer comment: unsafe/unable  Ambulation/Gait             General Gait Details: unsafe/unable  Stairs            Wheelchair Mobility    Modified Rankin (Stroke Patients Only)       Balance Overall balance assessment: Needs assistance Sitting-balance support: No upper extremity supported;Feet supported Sitting balance-Leahy Scale: Poor   Postural control: Posterior lean                                   Pertinent Vitals/Pain Pain Assessment: Faces Faces Pain Scale: No hurt    Home Living Family/patient expects to be discharged to:: Assisted living                 Additional Comments: Patient unable to provide accurate social history.  Per chart, is resident of [facility].    Prior Function Level of Independence: Needs assistance         Comments: Patient unable to provide accurate social history.  Per chart, is resident of [facility].  Ambulatory with RW at baseline; able to feed self.  Staff assists as needed with ADLs.     Hand Dominance        Extremity/Trunk Assessment   Upper Extremity Assessment Upper Extremity Assessment: Generalized weakness(grossly at least 3+ to 4-/5 throughout)  Lower Extremity Assessment Lower Extremity Assessment: Generalized weakness(grossly 3-/5 throughout; limited ability to follow formal ROM/MMT)       Communication      Cognition Arousal/Alertness: Lethargic Behavior During Therapy: WFL for tasks assessed/performed Overall Cognitive Status: No family/caregiver present to determine baseline cognitive functioning                                  General Comments: oriented to self only; unaware of location, situation despite multiple attempts at reorientation during session.  Limited ability to follow simple verbal commands, often requiring therapist demonstration or hand-over-hand assist for task initiation/completion.  Patient generally hyperverbal, speech incoherent and non-sensical at times.      General Comments      Exercises Other Exercises Other Exercises: Unsupported sitting balance edge of bed, min assist initially to attain/maintain position.  After 60-90 seconds, patient with abrupt posterior LOB, requiring max/dep assist for trunk control and safety. Other Exercises: Attempted reattempt at sitting/upright posture to allow for orthostatic assessment; patient refused   Assessment/Plan    PT Assessment Patient needs continued PT services  PT Problem List Decreased strength;Decreased range of motion;Decreased activity tolerance;Decreased balance;Decreased mobility;Decreased coordination;Decreased cognition;Decreased knowledge of use of DME;Decreased safety awareness;Decreased knowledge of precautions       PT Treatment Interventions DME instruction;Gait training;Functional mobility training;Therapeutic activities;Therapeutic exercise;Balance training;Cognitive remediation;Patient/family education    PT Goals (Current goals can be found in the Care Plan section)  Acute Rehab PT Goals PT Goal Formulation: Patient unable to participate in goal setting Time For Goal Achievement: 07/02/19 Potential to Achieve Goals: Fair    Frequency Min 2X/week   Barriers to discharge        Co-evaluation               AM-PAC PT "6 Clicks" Mobility  Outcome Measure Help needed turning from your back to your side while in a flat bed without using bedrails?: Total Help needed moving from lying on your back to sitting on the side of a flat bed without using bedrails?: Total Help needed moving to and from a bed to a chair  (including a wheelchair)?: Total Help needed standing up from a chair using your arms (e.g., wheelchair or bedside chair)?: Total Help needed to walk in hospital room?: Total Help needed climbing 3-5 steps with a railing? : Total 6 Click Score: 6    End of Session Equipment Utilized During Treatment: Gait belt Activity Tolerance: Patient limited by lethargy;Treatment limited secondary to medical complications (Comment)(BP/orthostasis?) Patient left: in bed;with call bell/phone within reach;with bed alarm set Nurse Communication: Mobility status PT Visit Diagnosis: Difficulty in walking, not elsewhere classified (R26.2);Muscle weakness (generalized) (M62.81)    Time: [id number] PT Time Calculation (min) (ACUTE ONLY): 22 min   Charges:   PT Evaluation $PT Eval Moderate Complexity: 1 Mod PT Treatments $Therapeutic Activity: 8-22 mins        Giomar Gusler H. Owens Shark, PT, DPT, NCS 06/18/19, 2:34 PM [phone]

## 2019-06-18 NOTE — Progress Notes (Signed)
New referral for outpatient Palliative to follow at [facility] rehab received from [facility]. Patient information faxed to referral. Flo Shanks BSN, RN, [place] [phone]

## 2019-06-18 NOTE — Progress Notes (Signed)
[place] [facility]                                                                                                                                                                                  Patient Demographics   Lindsey Cline, is a 85 y.o. female, DOB - 01-12-1919, [id number]  Admit date - 06/15/2019   Admitting Physician Lance Coon, MD  Outpatient Primary MD for the patient is Venia Carbon, MD   LOS - 2  Subjective: Patient's mental status is significantly improved but still some confusion    Review of Systems:   CONSTITUTIONAL: Confusion  Vitals:   Vitals:   06/17/19 1901 06/17/19 1921 06/18/19 0345 06/18/19 0714  BP:  110/83 (!) 109/55 125/68  Pulse: 82 94 88 99  Resp:  20 20 19   Temp:  98.6 F (37 C) 98.3 F (36.8 C) (!) 97.4 F (36.3 C)  TempSrc:  Oral Oral Oral  SpO2:  95% 95% 97%  Weight:      Height:        Wt Readings from Last 3 Encounters:  06/15/19 49.9 kg  06/03/19 54 kg  01/30/19 58.1 kg     Intake/Output Summary (Last 24 hours) at 06/18/2019 1322 Last data filed at 06/18/2019 1013 Gross per 24 hour  Intake 883 ml  Output 400 ml  Net 483 ml    Physical Exam:   GENERAL: Pleasant-appearing in no apparent distress.  HEAD, EYES, EARS, NOSE AND THROAT: Atraumatic, normocephalic. Extraocular muscles are intact. Pupils equal and reactive to light. Sclerae anicteric. No conjunctival injection. No oro-pharyngeal erythema.  NECK: Supple. There is no jugular venous distention. No bruits, no lymphadenopathy, no thyromegaly.  HEART: Regular rate and rhythm,. No murmurs, no rubs, no clicks.  LUNGS: Clear to auscultation bilaterally. No rales or rhonchi. No wheezes.  ABDOMEN: Soft, flat, nontender, nondistended. Has good bowel sounds. No hepatosplenomegaly appreciated.  EXTREMITIES: No evidence of any cyanosis, clubbing, or peripheral edema.  +2 pedal and radial pulses bilaterally.  There is some redness in the  lower extremity bilaterally which is improved compared to yesterday NEUROLOGIC: .  Patient is confused but able to follow commands sKIN: There is some warmth and erythema involving her bilateral lower extremity Psych: Not anxious, depressed LN: No inguinal LN enlargement    Antibiotics   Anti-infectives (From admission, onward)   Start     Dose/Rate Route Frequency Ordered Stop   06/17/19 1200  ceFAZolin (ANCEF) IVPB 1 g/50 mL premix     1 g 100 mL/hr over 30 Minutes Intravenous Every 12 hours 06/17/19 1151     06/16/19 0600  clindamycin (CLEOCIN) IVPB  600 mg  Status:  Discontinued     600 mg 100 mL/hr over 30 Minutes Intravenous Every 8 hours 06/16/19 0242 06/17/19 1149   06/16/19 0015  vancomycin (VANCOCIN) IVPB 1000 mg/200 mL premix     1,000 mg 200 mL/hr over 60 Minutes Intravenous  Once 06/16/19 0001 06/16/19 0344      Medications   Scheduled Meds: . chlorhexidine  15 mL Mouth Rinse BID  . diphenhydrAMINE  25 mg Intravenous Once  . feeding supplement (ENSURE ENLIVE)  237 mL Oral BID BM  . heparin  5,000 Units Subcutaneous Q8H  . LORazepam  2 mg Intravenous Once  . mouth rinse  15 mL Mouth Rinse q12n4p  . metoprolol tartrate  25 mg Oral BID  . [START ON 06/19/2019] multivitamin with minerals  1 tablet Oral Daily  . sodium chloride flush  3 mL Intravenous Q12H   Continuous Infusions: . sodium chloride 1,000 mL (06/17/19 2142)  .  ceFAZolin (ANCEF) IV 1 g (06/18/19 1126)   PRN Meds:.sodium chloride, acetaminophen **OR** acetaminophen, haloperidol lactate, metoprolol tartrate, ondansetron **OR** ondansetron (ZOFRAN) IV, sodium chloride flush   Data Review:   Micro Results Recent Results (from the past 240 hour(s))  SARS Coronavirus 2 [facility] order, Performed in [place] hospital lab)     Status: None   Collection Time: 06/16/19 12:57 AM  Result Value Ref Range Status   SARS Coronavirus 2 NEGATIVE NEGATIVE Final    Comment: (NOTE) If result is  NEGATIVE SARS-CoV-2 target nucleic acids are NOT DETECTED. The SARS-CoV-2 RNA is generally detectable in upper and lower  respiratory specimens during the acute phase of infection. The lowest  concentration of SARS-CoV-2 viral copies this assay can detect is 250  copies / mL. A negative result does not preclude SARS-CoV-2 infection  and should not be used as the sole basis for treatment or other  patient management decisions.  A negative result may occur with  improper specimen collection / handling, submission of specimen other  than nasopharyngeal swab, presence of viral mutation(s) within the  areas targeted by this assay, and inadequate number of viral copies  (<250 copies / mL). A negative result must be combined with clinical  observations, patient history, and epidemiological information. If result is POSITIVE SARS-CoV-2 target nucleic acids are DETECTED. The SARS-CoV-2 RNA is generally detectable in upper and lower  respiratory specimens dur ing the acute phase of infection.  Positive  results are indicative of active infection with SARS-CoV-2.  Clinical  correlation with patient history and other diagnostic information is  necessary to determine patient infection status.  Positive results do  not rule out bacterial infection or co-infection with other viruses. If result is PRESUMPTIVE POSTIVE SARS-CoV-2 nucleic acids MAY BE PRESENT.   A presumptive positive result was obtained on the submitted specimen  and confirmed on repeat testing.  While 2019 novel coronavirus  (SARS-CoV-2) nucleic acids may be present in the submitted sample  additional confirmatory testing may be necessary for epidemiological  and / or clinical management purposes  to differentiate between  SARS-CoV-2 and other Sarbecovirus currently known to infect humans.  If clinically indicated additional testing with an alternate test  methodology [phone]) is advised. The SARS-CoV-2 RNA is generally  detectable  in upper and lower respiratory sp ecimens during the acute  phase of infection. The expected result is Negative. Fact Sheet for Patients:  StrictlyIdeas.no Fact Sheet for Healthcare Providers: BankingDealers.co.za This test is not yet approved or cleared by the [place]  States FDA and has been authorized for detection and/or diagnosis of SARS-CoV-2 by FDA under an Emergency Use Authorization (EUA).  This EUA will remain in effect (meaning this test can be used) for the duration of the COVID-19 declaration under Section 564(b)(1) of the Act, 21 U.S.C. section 360bbb-3(b)(1), unless the authorization is terminated or revoked sooner. Performed at [facility], [address] [place], [place] [address]   MRSA PCR Screening     Status: None   Collection Time: 06/16/19  2:55 AM   Specimen: Nasal Mucosa; Nasopharyngeal  Result Value Ref Range Status   MRSA by PCR NEGATIVE NEGATIVE Final    Comment:        The GeneXpert MRSA Assay (FDA approved for NASAL specimens only), is one component of a comprehensive MRSA colonization surveillance program. It is not intended to diagnose MRSA infection nor to guide or monitor treatment for MRSA infections. Performed at [facility], [address], [place], [place] [address]     Radiology Reports Ct Head Wo Contrast  Result Date: 06/15/2019 CLINICAL DATA:  Altered mental status EXAM: CT HEAD WITHOUT CONTRAST TECHNIQUE: Contiguous axial images were obtained from the base of the skull through the vertex without intravenous contrast. COMPARISON:  01/22/2016 FINDINGS: Brain: There is atrophy and chronic small vessel disease changes. No acute intracranial abnormality. Specifically, no hemorrhage, hydrocephalus, mass lesion, acute infarction, or significant intracranial injury. Vascular: No hyperdense vessel or unexpected calcification. Skull: No acute calvarial abnormality. Sinuses/Orbits:  No acute finding Other: None IMPRESSION: Atrophy, chronic microvascular disease. No acute intracranial abnormality. Electronically Signed   By: Rolm Baptise M.D.   On: 06/15/2019 23:51   Mr Brain Wo Contrast  Result Date: 06/17/2019 CLINICAL DATA:  Initial evaluation for acute altered mental status, unexplained. EXAM: MRI HEAD WITHOUT CONTRAST TECHNIQUE: Multiplanar, multiecho pulse sequences of the brain and surrounding structures were obtained without intravenous contrast. COMPARISON:  Prior CT from 06/15/2019. FINDINGS: Brain: Diffuse prominence of the CSF containing spaces compatible with generalized age-related cerebral atrophy. Patchy and confluent T2/FLAIR hyperintensity within the periventricular and deep white matter both cerebral hemispheres as well as the pons, most consistent with chronic microvascular ischemic disease, mild for age. No abnormal foci of restricted diffusion to suggest acute or subacute ischemia. Gray-white matter differentiation maintained. No acute intracranial hemorrhage. Subcentimeter focus of susceptibility artifact at the right temporal occipital region consistent with a small chronic microhemorrhage, of doubtful significance in isolation. No mass lesion, midline shift or mass effect. No hydrocephalus. No extra-axial fluid collection. Vascular: Major intracranial vascular flow voids are grossly maintained at the skull base. Skull and upper cervical spine: Craniocervical junction within normal limits. No focal marrow replacing lesion. No scalp soft tissue abnormality. Sinuses/Orbits: Patient status post bilateral ocular lens replacement. Chronic ethmoidal sinusitis noted. Paranasal sinuses are otherwise clear. Trace bilateral mastoid effusions noted, of doubtful significance. Negative nasopharynx. Other: None. IMPRESSION: 1. No acute intracranial abnormality. 2. Generalized age-related cerebral atrophy with mild chronic small vessel ischemic disease. Electronically Signed   By:  Jeannine Boga M.D.   On: 06/17/2019 21:39     CBC Recent Labs  Lab 06/15/19 2225 06/16/19 0402 06/17/19 1041 06/18/19 0644  WBC 8.5 7.0 6.5 6.9  HGB 9.2* 7.9* 8.2* 8.6*  HCT 29.0* 24.7* 25.5* 27.0*  PLT 360 302 328 352  MCV 105.8* 105.6* 105.8* 105.5*  MCH 33.6 33.8 34.0 33.6  MCHC 31.7 32.0 32.2 31.9  RDW 16.9* 16.7* 16.9* 17.0*  LYMPHSABS 0.9  --   --   --  MONOABS 0.7  --   --   --   EOSABS 0.0  --   --   --   BASOSABS 0.0  --   --   --     Chemistries  Recent Labs  Lab 06/15/19 2225 06/16/19 0402 06/17/19 0929 06/18/19 0644  NA 141 138 138 143  K 3.9 3.0* 4.4 4.0  CL 100 104 103 107  CO2 29 25 24  21*  GLUCOSE 95 125* 82 97  BUN 42* 37* 33* 33*  CREATININE 1.51* 1.27* 1.51* 1.48*  CALCIUM 8.9 7.9* 8.1* 8.5*   ------------------------------------------------------------------------------------------------------------------ estimated creatinine clearance is 17.1 mL/min (A) (by C-G formula based on SCr of 1.48 mg/dL (H)). ------------------------------------------------------------------------------------------------------------------ No results for input(s): HGBA1C in the last 72 hours. ------------------------------------------------------------------------------------------------------------------ No results for input(s): CHOL, HDL, LDLCALC, TRIG, CHOLHDL, LDLDIRECT in the last 72 hours. ------------------------------------------------------------------------------------------------------------------ No results for input(s): TSH, T4TOTAL, T3FREE, THYROIDAB in the last 72 hours.  Invalid input(s): FREET3 ------------------------------------------------------------------------------------------------------------------ Recent Labs    06/16/19 0402 06/17/19 0929  VITAMINB12  --  984*  FOLATE 7.1  --   FERRITIN 193  --   TIBC 172*  --   IRON 28  --   RETICCTPCT 3.4*  --     Coagulation profile No results for input(s): INR, PROTIME in the last 168  hours.  No results for input(s): DDIMER in the last 72 hours.  Cardiac Enzymes No results for input(s): CKMB, TROPONINI, MYOGLOBIN in the last 168 hours.  Invalid input(s): CK ------------------------------------------------------------------------------------------------------------------ Invalid input(s): Mineral  Patient is 85 year old admitted with acute encephalopathy   1.  Acute encephalopathy  due to underlying cellulitis MRI of the brain shows no stroke Patient doing well mentally  2.   Chronic atrial fibrillation -  -patient's meds continued  3.  Essential hypertension, benign -continue home meds   4.  CKD (chronic kidney disease), stage III (HCC) -at baseline, avoid nephrotoxins and monitor   5.  Acute on chronic anemia anemia panel reviewed folate level and B12 level are normal as well as anemia panel seems okay continue to monitor  We will resume Eliquis repeat CBC in the morning       Code Status Orders  (From admission, onward)         Start     Ordered   06/16/19 0906  Do not attempt resuscitation (DNR)  Continuous    Question Answer Comment  In the event of cardiac or respiratory ARREST Do not call a "code blue"   In the event of cardiac or respiratory ARREST Do not perform Intubation, CPR, defibrillation or ACLS   In the event of cardiac or respiratory ARREST Use medication by any route, position, wound care, and other measures to relive pain and suffering. May use oxygen, suction and manual treatment of airway obstruction as needed for comfort.   Comments nmp      06/16/19 [id number]        Code Status History    Date Active Date Inactive Code Status Order ID Comments User Context   06/16/2019 0242 06/16/2019 0905 Full Code [id number]  Lance Coon, MD Inpatient   Advance Care Planning Activity    Advance Directive Documentation     Most Recent Value  Type of Advance Directive  Out of facility DNR (pink MOST or yellow form)   Pre-existing out of facility DNR order (yellow form or pink MOST form)  Pink MOST form placed in chart (order not valid for  inpatient use)  "MOST" Form in Place?  -           Consults  none  DVT Prophylaxiseliquis   Lab Results  Component Value Date   PLT 352 06/18/2019     Time Spent in minutes   49min Greater than 50% of time spent in care coordination and counseling patient regarding the condition and plan of care.   Dustin Flock M.D on 06/18/2019 at 1:22 PM  Between 7am to 6pm - Pager - [phone]  After 6pm go to [email or web address] - Proofreader  [employer]   Office  [phone]

## 2019-06-18 NOTE — Plan of Care (Signed)
  Problem: Education: Goal: Knowledge of General Education information will improve Description: Including pain rating scale, medication(s)/side effects and non-pharmacologic comfort measures Outcome: Not Progressing Note: Patient is overtly confused today. Awaiting insurance approval to go to a SNF. Will continue to monitor neurological status. Wenda Low [facility]

## 2019-06-19 DIAGNOSIS — L0390 Cellulitis, unspecified: Secondary | ICD-10-CM | POA: Diagnosis not present

## 2019-06-19 DIAGNOSIS — L97319 Non-pressure chronic ulcer of right ankle with unspecified severity: Secondary | ICD-10-CM | POA: Diagnosis not present

## 2019-06-19 DIAGNOSIS — F0150 Vascular dementia without behavioral disturbance: Secondary | ICD-10-CM | POA: Diagnosis not present

## 2019-06-19 DIAGNOSIS — M2550 Pain in unspecified joint: Secondary | ICD-10-CM | POA: Diagnosis not present

## 2019-06-19 DIAGNOSIS — R0902 Hypoxemia: Secondary | ICD-10-CM | POA: Diagnosis not present

## 2019-06-19 DIAGNOSIS — R262 Difficulty in walking, not elsewhere classified: Secondary | ICD-10-CM | POA: Diagnosis not present

## 2019-06-19 DIAGNOSIS — Z7401 Bed confinement status: Secondary | ICD-10-CM | POA: Diagnosis not present

## 2019-06-19 DIAGNOSIS — H3530 Unspecified macular degeneration: Secondary | ICD-10-CM | POA: Diagnosis not present

## 2019-06-19 DIAGNOSIS — I10 Essential (primary) hypertension: Secondary | ICD-10-CM | POA: Diagnosis not present

## 2019-06-19 DIAGNOSIS — R278 Other lack of coordination: Secondary | ICD-10-CM | POA: Diagnosis not present

## 2019-06-19 DIAGNOSIS — Z741 Need for assistance with personal care: Secondary | ICD-10-CM | POA: Diagnosis not present

## 2019-06-19 DIAGNOSIS — F05 Delirium due to known physiological condition: Secondary | ICD-10-CM | POA: Diagnosis not present

## 2019-06-19 DIAGNOSIS — G9340 Encephalopathy, unspecified: Secondary | ICD-10-CM | POA: Diagnosis not present

## 2019-06-19 DIAGNOSIS — L97919 Non-pressure chronic ulcer of unspecified part of right lower leg with unspecified severity: Secondary | ICD-10-CM | POA: Diagnosis not present

## 2019-06-19 DIAGNOSIS — I482 Chronic atrial fibrillation, unspecified: Secondary | ICD-10-CM | POA: Diagnosis not present

## 2019-06-19 DIAGNOSIS — I4819 Other persistent atrial fibrillation: Secondary | ICD-10-CM | POA: Diagnosis not present

## 2019-06-19 DIAGNOSIS — I129 Hypertensive chronic kidney disease with stage 1 through stage 4 chronic kidney disease, or unspecified chronic kidney disease: Secondary | ICD-10-CM | POA: Diagnosis not present

## 2019-06-19 DIAGNOSIS — R4182 Altered mental status, unspecified: Secondary | ICD-10-CM | POA: Diagnosis not present

## 2019-06-19 DIAGNOSIS — R404 Transient alteration of awareness: Secondary | ICD-10-CM | POA: Diagnosis not present

## 2019-06-19 DIAGNOSIS — N183 Chronic kidney disease, stage 3 (moderate): Secondary | ICD-10-CM | POA: Diagnosis not present

## 2019-06-19 DIAGNOSIS — I959 Hypotension, unspecified: Secondary | ICD-10-CM | POA: Diagnosis not present

## 2019-06-19 DIAGNOSIS — M6281 Muscle weakness (generalized): Secondary | ICD-10-CM | POA: Diagnosis not present

## 2019-06-19 DIAGNOSIS — R4189 Other symptoms and signs involving cognitive functions and awareness: Secondary | ICD-10-CM | POA: Diagnosis not present

## 2019-06-19 DIAGNOSIS — G3184 Mild cognitive impairment, so stated: Secondary | ICD-10-CM | POA: Diagnosis not present

## 2019-06-19 DIAGNOSIS — Z8673 Personal history of transient ischemic attack (TIA), and cerebral infarction without residual deficits: Secondary | ICD-10-CM | POA: Diagnosis not present

## 2019-06-19 DIAGNOSIS — D638 Anemia in other chronic diseases classified elsewhere: Secondary | ICD-10-CM | POA: Diagnosis not present

## 2019-06-19 DIAGNOSIS — M159 Polyosteoarthritis, unspecified: Secondary | ICD-10-CM | POA: Diagnosis not present

## 2019-06-19 DIAGNOSIS — L03119 Cellulitis of unspecified part of limb: Secondary | ICD-10-CM | POA: Diagnosis not present

## 2019-06-19 LAB — CBC
HCT: 28.5 % — ABNORMAL LOW (ref 36.0–46.0)
Hemoglobin: 9.2 g/dL — ABNORMAL LOW (ref 12.0–15.0)
MCH: 34.2 pg — ABNORMAL HIGH (ref 26.0–34.0)
MCHC: 32.3 g/dL (ref 30.0–36.0)
MCV: 105.9 fL — ABNORMAL HIGH (ref 80.0–100.0)
Platelets: 346 10*3/uL (ref 150–400)
RBC: 2.69 MIL/uL — ABNORMAL LOW (ref 3.87–5.11)
RDW: 16.9 % — ABNORMAL HIGH (ref 11.5–15.5)
WBC: 6.2 10*3/uL (ref 4.0–10.5)
nRBC: 1.8 % — ABNORMAL HIGH (ref 0.0–0.2)

## 2019-06-19 LAB — BASIC METABOLIC PANEL
Anion gap: 13 (ref 5–15)
BUN: 34 mg/dL — ABNORMAL HIGH (ref 8–23)
CO2: 22 mmol/L (ref 22–32)
Calcium: 8.6 mg/dL — ABNORMAL LOW (ref 8.9–10.3)
Chloride: 108 mmol/L (ref 98–111)
Creatinine, Ser: 1.28 mg/dL — ABNORMAL HIGH (ref 0.44–1.00)
GFR calc Af Amer: 41 mL/min — ABNORMAL LOW (ref >60)
GFR calc non Af Amer: 35 mL/min — ABNORMAL LOW (ref >60)
Glucose, Bld: 101 mg/dL — ABNORMAL HIGH (ref 70–99)
Potassium: 3.8 mmol/L (ref 3.5–5.1)
Sodium: 143 mmol/L (ref 135–145)

## 2019-06-19 MED ORDER — MEGESTROL ACETATE 400 MG/10ML PO SUSP
400.0000 mg | Freq: Every day | ORAL | Status: DC
  Administered 2019-06-19: 400 mg via ORAL
  Filled 2019-06-19: qty 10

## 2019-06-19 MED ORDER — CEFUROXIME AXETIL 250 MG PO TABS: 250.0000 mg | ORAL_TABLET | Freq: Two times a day (BID) | ORAL | 0 refills | Status: DC

## 2019-06-19 MED ORDER — MEGESTROL ACETATE 400 MG/10ML PO SUSP: 400.0000 mg | Freq: Every day | ORAL | 0 refills | Status: DC

## 2019-06-19 NOTE — Progress Notes (Signed)
EMS called, DNR in packet with dc summary.

## 2019-06-19 NOTE — Plan of Care (Signed)
  Problem: Clinical Measurements: Goal: Ability to maintain clinical measurements within normal limits will improve Outcome: Progressing Goal: Respiratory complications will improve Outcome: Progressing Goal: Cardiovascular complication will be avoided Outcome: Progressing   Problem: Pain Managment: Goal: General experience of comfort will improve Outcome: Progressing   Problem: Safety: Goal: Ability to remain free from injury will improve Outcome: Progressing   

## 2019-06-19 NOTE — Discharge Summary (Signed)
[facility] [place] [facility], 85 y.o., DOB April 07, 1919, MRN [id number]. Admission date: 06/15/2019 Discharge Date 06/19/2019 Primary MD Venia Carbon, MD Admitting Physician Lance Coon, MD  Admission Diagnosis  Altered mental status, unspecified altered mental status type [R41.82] Cellulitis, unspecified cellulitis site [L03.90]  Discharge Diagnosis   Principal Problem: Acute encephalopathy due to underlying cellulitis Chronic atrial fibrillation Essential hypertension Chronic kidney disease stage III Acute on chronic anemia due to anemia of chronic disease   Hospital Course Lindsey Cline  is a 85 y.o. female who presents with chief complaint as above.  Patient brought to the ED due to alteration of mental status.  She is somewhat combative here in the ED initially.  She has what appears to be some lower extremity cellulitis.  Patient was admitted and started on antibiotics.  She also had MRI to rule out a stroke.  Patient mental status is improved.  Continues to have some confusion likely related to acute hospitalization and delirium.  Overall based on advanced age her prognosis is very poor.    Palliative care team to follow at the skilled nursing facility       Consults  None  Significant Tests:  See full reports for all details     Ct Head Wo Contrast  Result Date: 06/15/2019 CLINICAL DATA:  Altered mental status EXAM: CT HEAD WITHOUT CONTRAST TECHNIQUE: Contiguous axial images were obtained from the base of the skull through the vertex without intravenous contrast. COMPARISON:  01/22/2016 FINDINGS: Brain: There is atrophy and chronic small vessel disease changes. No acute intracranial abnormality. Specifically, no hemorrhage, hydrocephalus, mass lesion, acute infarction, or significant intracranial injury. Vascular: No hyperdense vessel or unexpected calcification. Skull: No acute calvarial abnormality. Sinuses/Orbits: No acute finding  Other: None IMPRESSION: Atrophy, chronic microvascular disease. No acute intracranial abnormality. Electronically Signed   By: Rolm Baptise M.D.   On: 06/15/2019 23:51   Mr Brain Wo Contrast  Result Date: 06/17/2019 CLINICAL DATA:  Initial evaluation for acute altered mental status, unexplained. EXAM: MRI HEAD WITHOUT CONTRAST TECHNIQUE: Multiplanar, multiecho pulse sequences of the brain and surrounding structures were obtained without intravenous contrast. COMPARISON:  Prior CT from 06/15/2019. FINDINGS: Brain: Diffuse prominence of the CSF containing spaces compatible with generalized age-related cerebral atrophy. Patchy and confluent T2/FLAIR hyperintensity within the periventricular and deep white matter both cerebral hemispheres as well as the pons, most consistent with chronic microvascular ischemic disease, mild for age. No abnormal foci of restricted diffusion to suggest acute or subacute ischemia. Gray-white matter differentiation maintained. No acute intracranial hemorrhage. Subcentimeter focus of susceptibility artifact at the right temporal occipital region consistent with a small chronic microhemorrhage, of doubtful significance in isolation. No mass lesion, midline shift or mass effect. No hydrocephalus. No extra-axial fluid collection. Vascular: Major intracranial vascular flow voids are grossly maintained at the skull base. Skull and upper cervical spine: Craniocervical junction within normal limits. No focal marrow replacing lesion. No scalp soft tissue abnormality. Sinuses/Orbits: Patient status post bilateral ocular lens replacement. Chronic ethmoidal sinusitis noted. Paranasal sinuses are otherwise clear. Trace bilateral mastoid effusions noted, of doubtful significance. Negative nasopharynx. Other: None. IMPRESSION: 1. No acute intracranial abnormality. 2. Generalized age-related cerebral atrophy with mild chronic small vessel ischemic disease. Electronically Signed   By: Jeannine Boga M.D.   On: 06/17/2019 21:39       Today   Subjective:   Lindsey Cline patient pleasantly confused Objective:   Blood pressure 131/73, pulse Marland Kitchen)  101, temperature 97.6 F (36.4 C), temperature source Oral, resp. rate 19, height 5\' 4"  (1.626 m), weight 49.9 kg, SpO2 95 %.  .  Intake/Output Summary (Last 24 hours) at 06/19/2019 0906 Last data filed at 06/18/2019 2156 Gross per 24 hour  Intake -  Output 300 ml  Net -300 ml    Exam VITAL SIGNS: Blood pressure 131/73, pulse (!) 101, temperature 97.6 F (36.4 C), temperature source Oral, resp. rate 19, height 5\' 4"  (1.626 m), weight 49.9 kg, SpO2 95 %.  GENERAL:  85 y.o.-year-old patient lying in the bed with no acute distress.  EYES: Pupils equal, round, reactive to light and accommodation. No scleral icterus. Extraocular muscles intact.  HEENT: Head atraumatic, normocephalic. Oropharynx and nasopharynx clear.  NECK:  Supple, no jugular venous distention. No thyroid enlargement, no tenderness.  LUNGS: Normal breath sounds bilaterally, no wheezing, rales,rhonchi or crepitation. No use of accessory muscles of respiration.  CARDIOVASCULAR: S1, S2 normal. No murmurs, rubs, or gallops.  ABDOMEN: Soft, nontender, nondistended. Bowel sounds present. No organomegaly or mass.  EXTREMITIES: No pedal edema, cyanosis, or clubbing.  NEUROLOGIC: Cranial nerves II through XII are intact. Muscle strength 5/5 in all extremities. Sensation intact. Gait not checked.  PSYCHIATRIC: Confused SKIN: No obvious rash, lesion, or ulcer.   Data Review     CBC w Diff:  Lab Results  Component Value Date   WBC 6.2 06/19/2019   HGB 9.2 (L) 06/19/2019   HGB 8.4 (L) 05/16/2012   HCT 28.5 (L) 06/19/2019   HCT 24.2 (L) 05/16/2012   PLT 346 06/19/2019   PLT 143 (L) 05/16/2012   LYMPHOPCT 11 06/15/2019   LYMPHOPCT 26.1 05/16/2012   MONOPCT 8 06/15/2019   MONOPCT 8.3 05/16/2012   EOSPCT 0 06/15/2019   EOSPCT 2.4 05/16/2012   BASOPCT 0 06/15/2019    BASOPCT 1.0 05/16/2012   CMP:  Lab Results  Component Value Date   NA 143 06/19/2019   NA 143 05/14/2012   K 3.8 06/19/2019   K 4.2 05/14/2012   CL 108 06/19/2019   CL 111 (H) 05/14/2012   CO2 22 06/19/2019   CO2 25 05/14/2012   BUN 34 (H) 06/19/2019   BUN 39 (H) 05/14/2012   CREATININE 1.28 (H) 06/19/2019   CREATININE 1.26 05/14/2012   PROT 6.8 01/03/2017   PROT 7.1 05/13/2012   ALBUMIN 4.0 01/03/2017   ALBUMIN 3.7 05/13/2012   BILITOT 0.5 01/03/2017   BILITOT 0.4 05/13/2012   ALKPHOS 85 01/03/2017   ALKPHOS 71 05/13/2012   AST 21 01/03/2017   AST 28 05/13/2012   ALT 15 01/03/2017   ALT 14 05/13/2012  .  Micro Results Recent Results (from the past 240 hour(s))  SARS Coronavirus 2 [facility] order, Performed in [facility] hospital lab)     Status: None   Collection Time: 06/16/19 12:57 AM  Result Value Ref Range Status   SARS Coronavirus 2 NEGATIVE NEGATIVE Final    Comment: (NOTE) If result is NEGATIVE SARS-CoV-2 target nucleic acids are NOT DETECTED. The SARS-CoV-2 RNA is generally detectable in upper and lower  respiratory specimens during the acute phase of infection. The lowest  concentration of SARS-CoV-2 viral copies this assay can detect is 250  copies / mL. A negative result does not preclude SARS-CoV-2 infection  and should not be used as the sole basis for treatment or other  patient management decisions.  A negative result may occur with  improper specimen collection / handling, submission of specimen other  than nasopharyngeal swab, presence of viral mutation(s) within the  areas targeted by this assay, and inadequate number of viral copies  (<250 copies / mL). A negative result must be combined with clinical  observations, patient history, and epidemiological information. If result is POSITIVE SARS-CoV-2 target nucleic acids are DETECTED. The SARS-CoV-2 RNA is generally detectable in upper and lower  respiratory specimens dur ing the acute phase  of infection.  Positive  results are indicative of active infection with SARS-CoV-2.  Clinical  correlation with patient history and other diagnostic information is  necessary to determine patient infection status.  Positive results do  not rule out bacterial infection or co-infection with other viruses. If result is PRESUMPTIVE POSTIVE SARS-CoV-2 nucleic acids MAY BE PRESENT.   A presumptive positive result was obtained on the submitted specimen  and confirmed on repeat testing.  While 2019 novel coronavirus  (SARS-CoV-2) nucleic acids may be present in the submitted sample  additional confirmatory testing may be necessary for epidemiological  and / or clinical management purposes  to differentiate between  SARS-CoV-2 and other Sarbecovirus currently known to infect humans.  If clinically indicated additional testing with an alternate test  methodology [id number]) is advised. The SARS-CoV-2 RNA is generally  detectable in upper and lower respiratory sp ecimens during the acute  phase of infection. The expected result is Negative. Fact Sheet for Patients:  StrictlyIdeas.no Fact Sheet for Healthcare Providers: BankingDealers.co.za This test is not yet approved or cleared by the [place] FDA and has been authorized for detection and/or diagnosis of SARS-CoV-2 by FDA under an Emergency Use Authorization (EUA).  This EUA will remain in effect (meaning this test can be used) for the duration of the COVID-19 declaration under Section 564(b)(1) of the Act, 21 U.S.C. section 360bbb-3(b)(1), unless the authorization is terminated or revoked sooner. Performed at [facility], [address] [place], [place] [address]   MRSA PCR Screening     Status: None   Collection Time: 06/16/19  2:55 AM   Specimen: Nasal Mucosa; Nasopharyngeal  Result Value Ref Range Status   MRSA by PCR NEGATIVE NEGATIVE Final    Comment:        The  GeneXpert MRSA Assay (FDA approved for NASAL specimens only), is one component of a comprehensive MRSA colonization surveillance program. It is not intended to diagnose MRSA infection nor to guide or monitor treatment for MRSA infections. Performed at [facility], [address] [place], [place] [address]         Code Status Orders  (From admission, onward)         Start     Ordered   06/16/19 0906  Do not attempt resuscitation (DNR)  Continuous    Question Answer Comment  In the event of cardiac or respiratory ARREST Do not call a "code blue"   In the event of cardiac or respiratory ARREST Do not perform Intubation, CPR, defibrillation or ACLS   In the event of cardiac or respiratory ARREST Use medication by any route, position, wound care, and other measures to relive pain and suffering. May use oxygen, suction and manual treatment of airway obstruction as needed for comfort.   Comments nmp      06/16/19 [id number]        Code Status History    Date Active Date Inactive Code Status Order ID Comments User Context   06/16/2019 0242 06/16/2019 0905 Full Code [id number]  Lance Coon, MD Inpatient   Advance Care Planning  Activity    Advance Directive Documentation     Most Recent Value  Type of Advance Directive  Out of facility DNR (pink MOST or yellow form)  Pre-existing out of facility DNR order (yellow form or pink MOST form)  Pink MOST form placed in chart (order not valid for inpatient use)  "MOST" Form in Place?  -            Discharge Medications   Allergies as of 06/19/2019   No Known Allergies     Medication List    STOP taking these medications   furosemide 20 MG tablet Commonly known as: LASIX   lisinopril-hydrochlorothiazide 20-12.5 MG tablet Commonly known as: ZESTORETIC     TAKE these medications   acetaminophen 325 MG tablet Commonly known as: TYLENOL Take 650 mg by mouth every 4 (four) hours as needed for mild pain or fever.    acetaminophen 650 MG CR tablet Commonly known as: TYLENOL Take 650 mg by mouth at bedtime.   acetaminophen 650 MG CR tablet Commonly known as: TYLENOL Take 650 mg by mouth 2 (two) times daily.   apixaban 2.5 MG Tabs tablet Commonly known as: ELIQUIS Take 1 tablet (2.5 mg total) by mouth 2 (two) times daily.   carbamide peroxide 6.5 % OTIC solution Commonly known as: DEBROX Place 5 drops into both ears as needed (ear wax).   cefUROXime 250 MG tablet Commonly known as: Ceftin Take 1 tablet (250 mg total) by mouth 2 (two) times daily with a meal for 4 days.   cetirizine 10 MG tablet Commonly known as: ZYRTEC Take 10 mg by mouth as needed for allergies.   dextromethorphan-guaiFENesin 10-100 MG/5ML liquid Commonly known as: ROBITUSSIN-DM Take 10 mLs by mouth every 4 (four) hours as needed for cough.   diphenhydrAMINE 25 mg capsule Commonly known as: BENADRYL Take 25 mg by mouth once as needed (allergic reaction).   Glucose 15 GM/32ML Gel Take 1 packet by mouth as needed (low blood sugar).   ipratropium 0.03 % nasal spray Commonly known as: ATROVENT Place 2 sprays into both nostrils 3 (three) times daily as needed for rhinitis.   Kaopectate 262 MG/15ML suspension Generic drug: bismuth subsalicylate Take 10 mLs by mouth as needed for diarrhea or loose stools.   megestrol 400 MG/10ML suspension Commonly known as: MEGACE Take 10 mLs (400 mg total) by mouth daily.   metoprolol tartrate 25 MG tablet Commonly known as: LOPRESSOR Take 1 tablet (25 mg total) by mouth 2 (two) times daily.   Milk of Magnesia 400 MG/5ML suspension Generic drug: magnesium hydroxide Take 30 mLs by mouth daily as needed for mild constipation.   mirtazapine 15 MG tablet Commonly known as: REMERON Take 15 mg by mouth daily.   Mylanta 200-200-20 MG/5ML suspension Generic drug: alum & mag hydroxide-simeth Take 30 mLs by mouth every 4 (four) hours as needed for indigestion or flatulence (upset  stomach).   nystatin powder Commonly known as: MYCOSTATIN/NYSTOP Apply topically as needed (skin breakdown).   ondansetron 4 MG tablet Commonly known as: ZOFRAN Take 4 mg by mouth every 8 (eight) hours as needed for nausea.   polyvinyl alcohol 1.4 % ophthalmic solution Commonly known as: LIQUIFILM TEARS Place 1 drop into both eyes as needed for dry eyes.   PROBIOTIC DAILY PO Take 1 capsule by mouth daily.   Vitamin D (Ergocalciferol) 1.25 MG (50000 UT) Caps capsule Commonly known as: DRISDOL Take 50,000 Units by mouth every 14 (fourteen) days.  Total Time in preparing paper work, data evaluation and todays exam - 45 minutes  Dustin Flock M.D on 06/19/2019 at 9:06 AM [place]  [phone]

## 2019-06-19 NOTE — Progress Notes (Signed)
Attempted to call report. Was told to call [phone]. Erline Levine RN took report. Questions asked and answered. Denies further questions.

## 2019-06-19 NOTE — TOC Transition Note (Signed)
Transition of Care [facility]) - CM/SW Discharge Note   Patient Details  Name: Lindsey Cline MRN: [id number] Date of Birth: [birth year]  [facility]) CM/SW Contact:  Elza Rafter, RN Phone Number: 06/19/2019, 10:48 AM   Clinical Narrative:   Patient is discharging today to [facility] room 229.  EMS packet placed on chart with DNR.  Kathlee Nations, RN aware. Left message with Dan Humphreys that patient will be discharging today to [facility].  No further needs identified at this time  By CM.      Final next level of care: [place]     Patient Goals and CMS Choice Patient states their goals for this hospitalization and ongoing recovery are:: Spoke with son Jeneen Rinks and he is in agreement to work up for SNF CMS Medicare.gov Compare Post Acute Care list provided to:: Patient Represenative (must comment) Choice offered to / list presented to : Adult Children  Discharge Placement                       Discharge Plan and Services   Discharge Planning Services: CM Consult                                 Social Determinants of Health (SDOH) Interventions     Readmission Risk Interventions No flowsheet data found.

## 2019-06-19 NOTE — Care Management Important Message (Signed)
Important Message  Patient Details  Name: Lindsey Cline MRN: [id number] Date of Birth: 1918/12/16   Medicare Important Message Given:  Yes  Reviewed verbally with son, Rhesa Pirraglia, at [phone].  Aware of right an in agreement with discharge plan.  Copy of Medicare IM sent securely to email on chart after confirming address.   Dannette Barbara 06/19/2019, 12:29 PM

## 2019-06-19 NOTE — Discharge Instructions (Signed)

## 2019-06-19 NOTE — Progress Notes (Signed)
Nsg Discharge Note  Admit Date:  06/15/2019 Discharge date: 06/19/2019   Sherral Hammers to be D/C'd Skilled nursing facility per MD order.  AVS completed.  Copy for chart, and copy for patient signed, and dated. Patient/caregiver able to verbalize understanding.  Discharge Medication: Allergies as of 06/19/2019   No Known Allergies     Medication List    STOP taking these medications   furosemide 20 MG tablet Commonly known as: LASIX   lisinopril-hydrochlorothiazide 20-12.5 MG tablet Commonly known as: ZESTORETIC     TAKE these medications   acetaminophen 325 MG tablet Commonly known as: TYLENOL Take 650 mg by mouth every 4 (four) hours as needed for mild pain or fever.   acetaminophen 650 MG CR tablet Commonly known as: TYLENOL Take 650 mg by mouth at bedtime.   acetaminophen 650 MG CR tablet Commonly known as: TYLENOL Take 650 mg by mouth 2 (two) times daily.   apixaban 2.5 MG Tabs tablet Commonly known as: ELIQUIS Take 1 tablet (2.5 mg total) by mouth 2 (two) times daily.   carbamide peroxide 6.5 % OTIC solution Commonly known as: DEBROX Place 5 drops into both ears as needed (ear wax).   cefUROXime 250 MG tablet Commonly known as: Ceftin Take 1 tablet (250 mg total) by mouth 2 (two) times daily with a meal for 4 days.   cetirizine 10 MG tablet Commonly known as: ZYRTEC Take 10 mg by mouth as needed for allergies.   dextromethorphan-guaiFENesin 10-100 MG/5ML liquid Commonly known as: ROBITUSSIN-DM Take 10 mLs by mouth every 4 (four) hours as needed for cough.   diphenhydrAMINE 25 mg capsule Commonly known as: BENADRYL Take 25 mg by mouth once as needed (allergic reaction).   Glucose 15 GM/32ML Gel Take 1 packet by mouth as needed (low blood sugar).   ipratropium 0.03 % nasal spray Commonly known as: ATROVENT Place 2 sprays into both nostrils 3 (three) times daily as needed for rhinitis.   Kaopectate 262 MG/15ML suspension Generic drug: bismuth  subsalicylate Take 10 mLs by mouth as needed for diarrhea or loose stools.   megestrol 400 MG/10ML suspension Commonly known as: MEGACE Take 10 mLs (400 mg total) by mouth daily.   metoprolol tartrate 25 MG tablet Commonly known as: LOPRESSOR Take 1 tablet (25 mg total) by mouth 2 (two) times daily.   Milk of Magnesia 400 MG/5ML suspension Generic drug: magnesium hydroxide Take 30 mLs by mouth daily as needed for mild constipation.   mirtazapine 15 MG tablet Commonly known as: REMERON Take 15 mg by mouth daily.   Mylanta 200-200-20 MG/5ML suspension Generic drug: alum & mag hydroxide-simeth Take 30 mLs by mouth every 4 (four) hours as needed for indigestion or flatulence (upset stomach).   nystatin powder Commonly known as: MYCOSTATIN/NYSTOP Apply topically as needed (skin breakdown).   ondansetron 4 MG tablet Commonly known as: ZOFRAN Take 4 mg by mouth every 8 (eight) hours as needed for nausea.   polyvinyl alcohol 1.4 % ophthalmic solution Commonly known as: LIQUIFILM TEARS Place 1 drop into both eyes as needed for dry eyes.   PROBIOTIC DAILY PO Take 1 capsule by mouth daily.   Vitamin D (Ergocalciferol) 1.25 MG (50000 UT) Caps capsule Commonly known as: DRISDOL Take 50,000 Units by mouth every 14 (fourteen) days.       Discharge Assessment: Vitals:   06/19/19 0344 06/19/19 0726  BP: 115/72 131/73  Pulse: 95 (!) 101  Resp:  19  Temp: 97.6 F (36.4 C)  SpO2: 97% 95%   Skin clean, dry and intact without evidence of skin break down, no evidence of skin tears noted. IV catheter discontinued intact. Site without signs and symptoms of complications - no redness or edema noted at insertion site, patient denies c/o pain - only slight tenderness at site.  Dressing with slight pressure applied.  D/c Instructions-Education: Discharge instructions given to patient/family with verbalized understanding. D/c education completed with patient/family including follow up  instructions, medication list, d/c activities limitations if indicated, with other d/c instructions as indicated by MD - patient able to verbalize understanding, all questions fully answered. Patient instructed to return to ED, call 911, or call MD for any changes in condition.  Patient escorted via [place], and D/C home via private auto.  Eda Keys, RN 06/19/2019 1:32 PM

## 2019-06-20 ENCOUNTER — Telehealth: Payer: Self-pay | Admitting: Internal Medicine

## 2019-06-20 NOTE — Telephone Encounter (Signed)
Long phone call with son after hearing from [facility] nurse that they were having trouble arousing her, she is not eating or drinking, and sats low (up in 80's with O2 though)  Her status worsened during the time in the hospital. Discussed stroke (not seen on MRI) vs delirium Decided on no hospitalization Consider hospice if not doing rehab

## 2019-06-23 DIAGNOSIS — L97319 Non-pressure chronic ulcer of right ankle with unspecified severity: Secondary | ICD-10-CM | POA: Diagnosis not present

## 2019-06-23 DIAGNOSIS — Z8673 Personal history of transient ischemic attack (TIA), and cerebral infarction without residual deficits: Secondary | ICD-10-CM | POA: Diagnosis not present

## 2019-06-23 DIAGNOSIS — F05 Delirium due to known physiological condition: Secondary | ICD-10-CM | POA: Diagnosis not present

## 2019-06-23 DIAGNOSIS — M159 Polyosteoarthritis, unspecified: Secondary | ICD-10-CM

## 2019-06-23 DIAGNOSIS — F0150 Vascular dementia without behavioral disturbance: Secondary | ICD-10-CM

## 2019-06-23 DIAGNOSIS — I10 Essential (primary) hypertension: Secondary | ICD-10-CM

## 2019-06-23 DIAGNOSIS — I4819 Other persistent atrial fibrillation: Secondary | ICD-10-CM | POA: Diagnosis not present

## 2019-07-10 DEATH — deceased

## 2019-09-26 ENCOUNTER — Ambulatory Visit: Payer: Medicare Other | Admitting: Cardiovascular Disease

## 2019-10-04 IMAGING — MR MR HEAD W/O CM
10 series · 48 of 48 positions shown · non-contrast
Comparison: Prior CT from 06/15/2019.

CLINICAL DATA: Initial evaluation for acute altered mental status,
unexplained.

EXAM:
MRI HEAD WITHOUT CONTRAST
TECHNIQUE: Multiplanar, multiecho pulse sequences of the brain and surrounding
structures were obtained without intravenous contrast.

[Series 2: ax dwi_tracew · axial · 3.0mm · 1.31mm/px · z∈[-18,+120]mm · 8 of 48 slices shown]
[im 1/48]
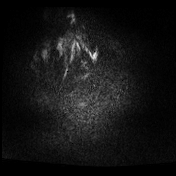
[im 7/48]
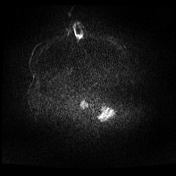
[im 14/48]
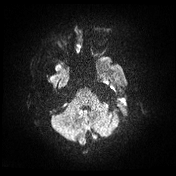
[im 21/48]
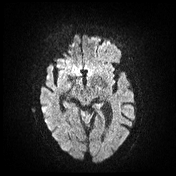
[im 27/48]
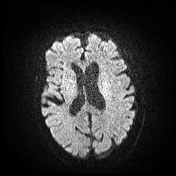
[im 34/48]
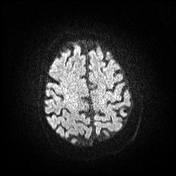
[im 41/48]
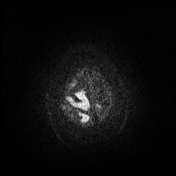
[im 48/48]
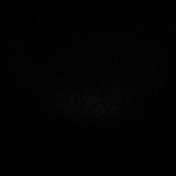

[Series 3: ax dwi_adc · axial · 3.0mm · 1.31mm/px · z∈[-18,+117]mm · 7 of 47 slices shown]
[im 1/47]
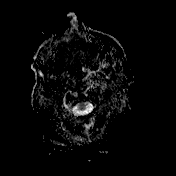
[im 8/47]
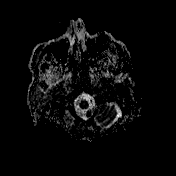
[im 16/47]
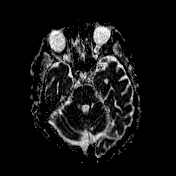
[im 24/47]
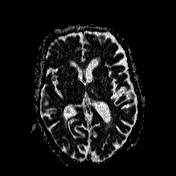
[im 31/47]
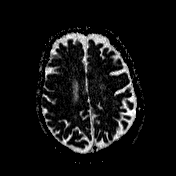
[im 39/47]
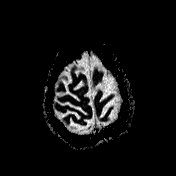
[im 47/47]
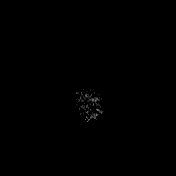

[Series 4: cor dwi_tracew · coronal · 5.0mm · 1.31mm/px · 5 of 38 slices shown]
[im 1/38]
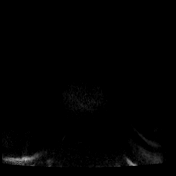
[im 10/38]
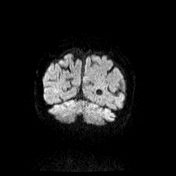
[im 19/38]
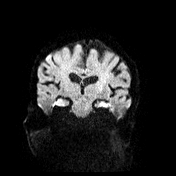
[im 28/38]
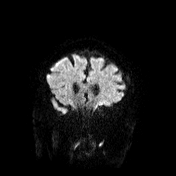
[im 38/38]
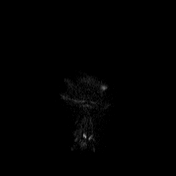

[Series 5: cor dwi_adc · coronal · 5.0mm · 1.31mm/px · 5 of 38 slices shown]
[im 1/38]
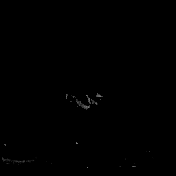
[im 10/38]
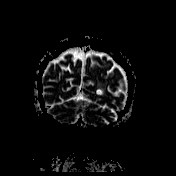
[im 19/38]
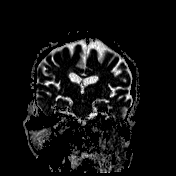
[im 28/38]
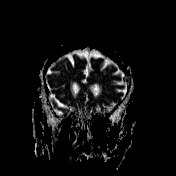
[im 38/38]
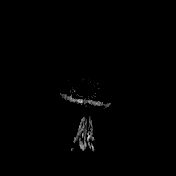

[Series 6: T2 · axial · 5.0mm · 0.45mm/px · z∈[-19,+120]mm · 4 of 27 slices shown (1 of 2)]
[im 1/27]
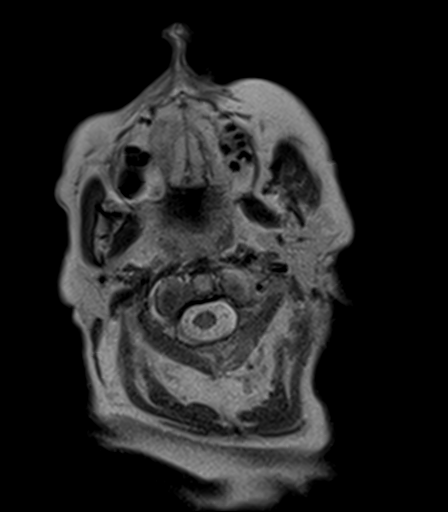
[im 9/27]
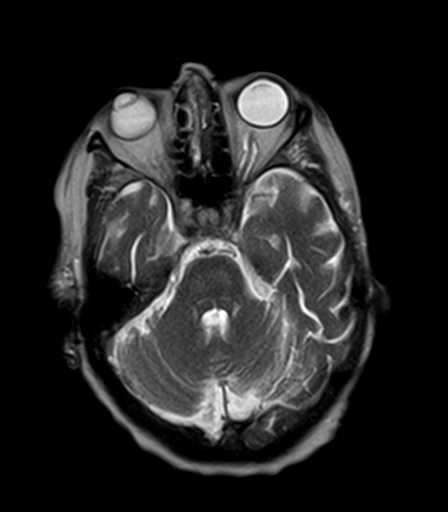
[im 18/27]
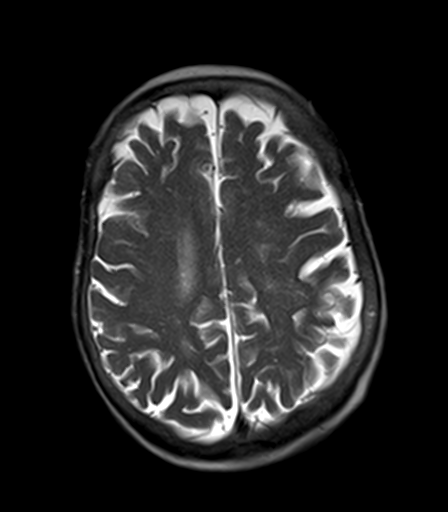
[im 27/27]
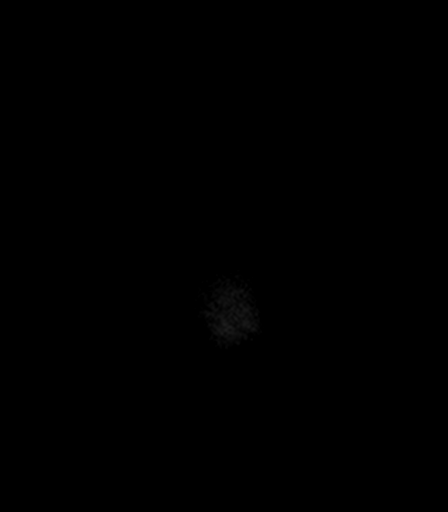

[Series 7: T2-star · axial · 5.0mm · 0.45mm/px · z∈[-19,+120]mm · 4 of 27 slices shown]
[im 1/27]
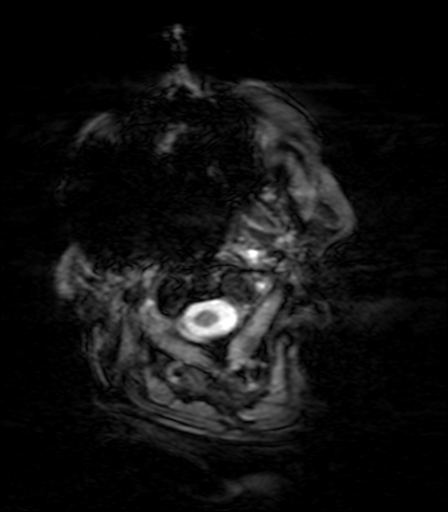
[im 9/27]
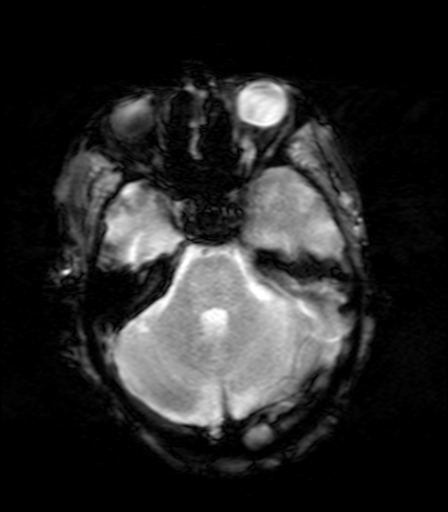
[im 18/27]
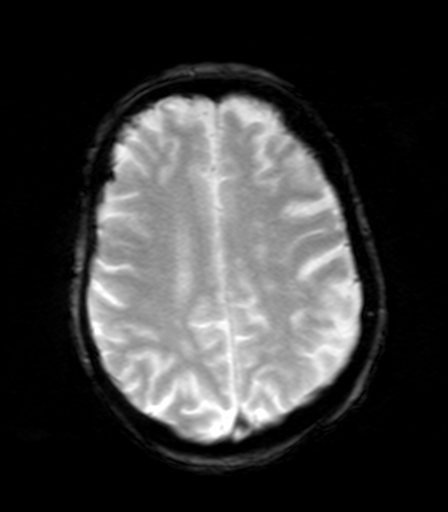
[im 27/27]
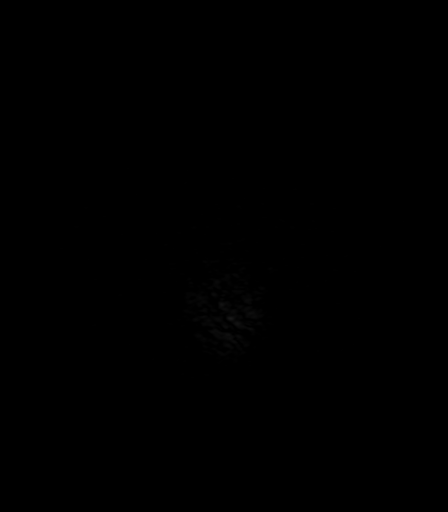

[Series 8: FLAIR · axial · 5.0mm · 1.20mm/px · z∈[-18,+120]mm · 4 of 27 slices shown]
[im 1/27]
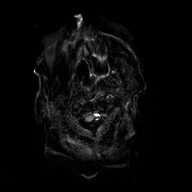
[im 9/27]
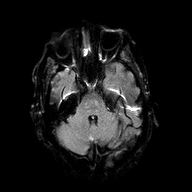
[im 18/27]
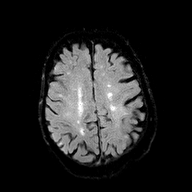
[im 27/27]
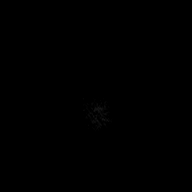

[Series 9: T1 · axial · 5.0mm · 0.90mm/px · z∈[-19,+120]mm · 4 of 27 slices shown (1 of 2)]
[im 1/27]
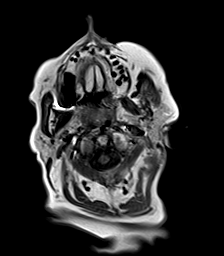
[im 9/27]
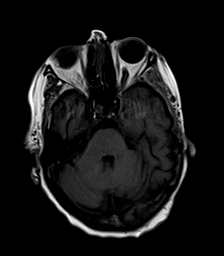
[im 18/27]
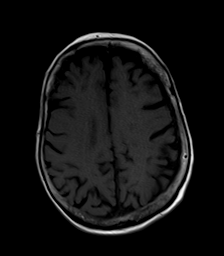
[im 27/27]
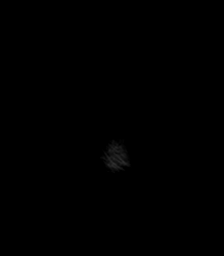

[Series 10: T1 · sagittal · 5.0mm · 0.94mm/px · 3 of 23 slices shown (2 of 2)]
[im 1/23]
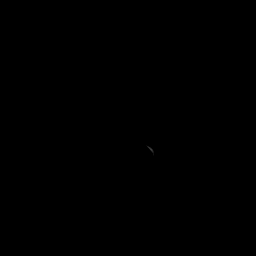
[im 12/23]
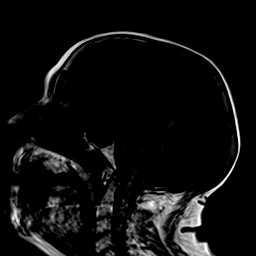
[im 23/23]
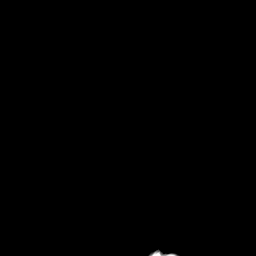

[Series 11: T2 · coronal · 5.0mm · 0.45mm/px · 4 of 31 slices shown (2 of 2)]
[im 1/31]
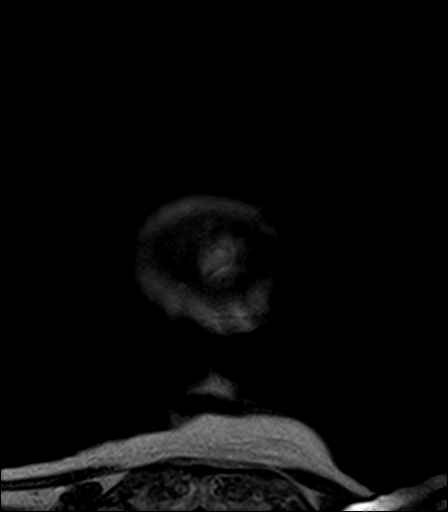
[im 11/31]
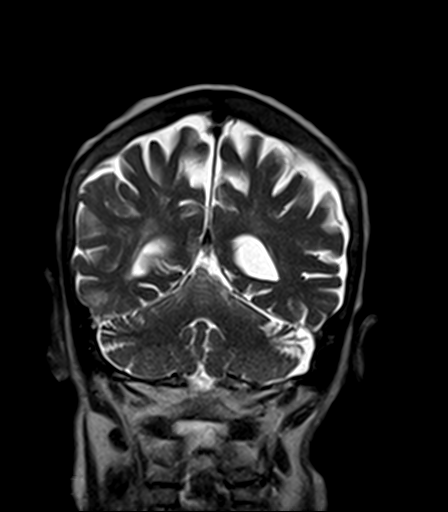
[im 21/31]
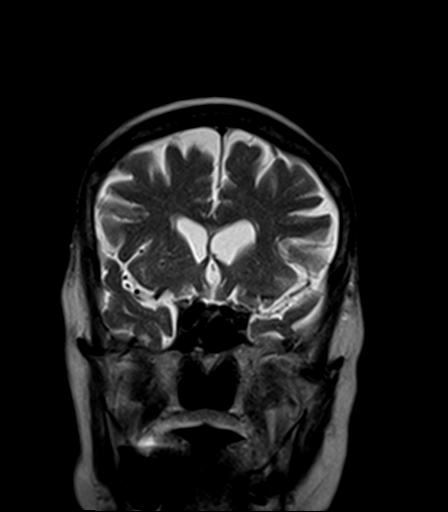
[im 31/31]
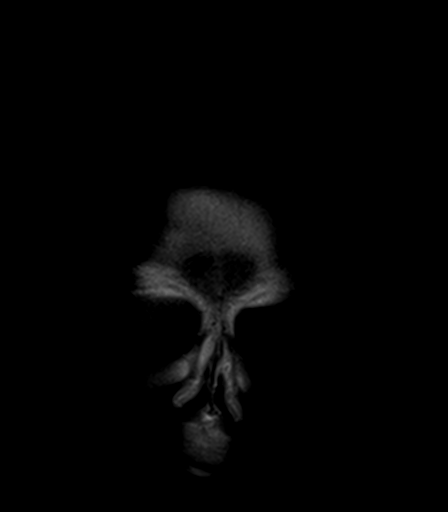

[48 of 48 positions shown; findings below may reference images not displayed]

FINDINGS: Brain: Diffuse prominence of the CSF containing spaces compatible
with generalized age-related cerebral atrophy. Patchy and confluent
T2/FLAIR hyperintensity within the periventricular and deep white
matter both cerebral hemispheres as well as the pons, most
consistent with chronic microvascular ischemic disease, mild for
age.

No abnormal foci of restricted diffusion to suggest acute or
subacute ischemia. Gray-white matter differentiation maintained. No
acute intracranial hemorrhage. Subcentimeter focus of susceptibility
artifact at the right temporal occipital region consistent with a
small chronic microhemorrhage, of doubtful significance in
isolation.

No mass lesion, midline shift or mass effect. No hydrocephalus. No
extra-axial fluid collection.

Vascular: Major intracranial vascular flow voids are grossly
maintained at the skull base.

Skull and upper cervical spine: Craniocervical junction within
normal limits. No focal marrow replacing lesion. No scalp soft
tissue abnormality.

Sinuses/Orbits: Patient status post bilateral ocular lens
replacement. Chronic ethmoidal sinusitis noted. Paranasal sinuses
are otherwise clear. Trace bilateral mastoid effusions noted, of
doubtful significance. Negative nasopharynx.

Other: None.
IMPRESSION: 1. No acute intracranial abnormality.
2. Generalized age-related cerebral atrophy with mild chronic small
vessel ischemic disease.
